# Patient Record
Sex: Female | Born: 1995 | Race: White | Hispanic: No | Marital: Single
Health system: Southern US, Community
[De-identification: ages and names within clinical notes are randomized; demographics above are authoritative.]

## PROBLEM LIST (undated history)

## (undated) DIAGNOSIS — G932 Benign intracranial hypertension: Principal | ICD-10-CM

---
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
## 2017-07-25 NOTE — Nursing Note (Signed)
Nursing Discharge Summary - Text       Physician Discharge Summary Entered On:  07/25/2017 19:15 EDT    Performed On:  07/25/2017 19:15 EDT by Sandara Tyree-PA,  Betti CruzKATE S               DC Information   Provider Instructions for Diet :   A Healthy Diet, Regular diet   Provider Instructions for Activity :   Other: STRICT NON WEIGHT BEARING RIGHT ACHILLES, ELEVATION ABOVE LEVEL OF HEART   Provider Instructions for Wound Care :   Other: KEEP SPLINT CLEAN, DRY, INTACT UNTIL FIRST POST OP VISIT   Nicosha Struve-PA,  Simpson Paulos S - 07/25/2017 19:15 EDT

## 2017-07-26 NOTE — Nursing Note (Signed)
Medication Administration Follow Up-Text       Medication Administration Follow Up Entered On:  07/26/2017 17:21 EDT    Performed On:  07/26/2017 16:46 EDT by Elijah BirkALDWELL, RN, TARA J      Intervention Information:     hydromorphone  Performed by Elijah BirkALDWELL, RN, TARA J on 07/26/2017 16:31:00 EDT       hydromorphone,0.5mg   IV Push,Antecubital, Right,other (see comment)       Med Response   ED Medication Response :   No adverse reaction, Symptoms improved   Numeric Rating Pain Scale :   6   Pasero Opioid Induced Sedation Scale :   1 = Awake and alert   CALDWELL, RN, TARA J - 07/26/2017 17:21 EDT

## 2017-07-26 NOTE — Discharge Summary (Signed)
 Inpatient Patient Summary               [facility]  [address] [place], [place] [address]  [phone]  Patient Discharge Instructions     Name: Carrie Shields, HENAULT  Current Date: 07/26/2017 16:38:59  DOB: 01-16-1995 [id number] FIN:[id number]%[phone]  Patient Address: [address] [place] [address]  Patient Phone: [phone]  Primary Care Provider:  Name: MELODY ORLEAN RUTH  Phone: [phone]   Immunizations Provided:      Discharge Diagnosis: Rupture of right Achilles tendon  Discharged To: TO, ANTICIPATED%>  Home Treatments: TREATMENTS, ANTICIPATED%>  Devices/Equipment: EQUIPMENT REHAB%>  Post Hospital Services: HOSPITAL SERVICES%>  Professional Skilled Services: SKILLED SERVICES%>  Therapist, sports and Community Resources: SERV AND COMM RES, ANTICIPATED%>  Mode of Discharge Transportation: TRANSPORTATION%>  Discharge Orders:          Discharge Patient 07/26/17 16:15:00 EDT, Discharge Home/Self Care         Comment:   Medications  During the course of your visit, your medication list was updated with the most current information. The details of those changes are reflected below:          New Medications  Other Medications  aspirin (aspirin 81 mg oral tablet) 1 Tabs Oral (given by mouth) 2 times a day for 45 Days. BEGIN IN AM 7/15, TWICE DAILY FOR DVT PROPHYLAXIS. Refills: 0.  Last Dose:____________________  docusate (Colace Clear oral capsule) 1 Capsules Oral (given by mouth) 2 times a day. Refills: 0.  Last Dose:____________________  ondansetron  (Zofran  4 mg oral tablet) 1 Tabs Oral (given by mouth) every 6 hours as needed as needed for nausea/vomiting for 5 Days. Refills: 1.  Last Dose:____________________  oxyCODONE  (oxyCODONE  5 mg oral tablet) 1 Tabs Oral (given by mouth) every 4 hours as needed moderate pain (4-7). 1-2 TABS EVERY 4-6 HR PRN PAIN. Refills: 0.  Last Dose:____________________  Medications that have not changed  Other Medications  cetirizine (ZyrTEC 10 mg oral tablet)  1 Tabs Oral (given by mouth) every day as needed.  Last Dose:____________________       [facility] would like to thank you for allowing us  to assist you with your healthcare needs. The following includes patient education materials and information regarding your injury/illness.   Shields, Carrie L has been given the following list of follow-up instructions, prescriptions, and patient education materials:  Follow-up Instructions:              With: Address: When:   f/u 10-14 days post op, call office for appt/with questions. [phone]                    It is important to always keep an active list of medications available so that you can share with other providers and manage your medications appropriately. As an additional courtesy, we are also providing you with your final active medications list that you can keep with you.            aspirin (aspirin 81 mg oral tablet) 1 Tabs Oral (given by mouth) 2 times a day for 45 Days. BEGIN IN AM 7/15, TWICE DAILY FOR DVT PROPHYLAXIS. Refills: 0.  cetirizine (ZyrTEC 10 mg oral tablet) 1 Tabs Oral (given by mouth) every day as needed.  docusate (Colace Clear oral capsule) 1 Capsules Oral (given by mouth) 2 times a day. Refills: 0.  ondansetron  (Zofran  4 mg oral tablet) 1 Tabs Oral (given by  mouth) every 6 hours as needed as needed for nausea/vomiting for 5 Days. Refills: 1.  oxyCODONE  (oxyCODONE  5 mg oral tablet) 1 Tabs Oral (given by mouth) every 4 hours as needed moderate pain (4-7). 1-2 TABS EVERY 4-6 HR PRN PAIN. Refills: 0.      Take only the medications listed above. Contact your doctor prior to taking any medications not on this list.  Discharge instructions, if any, will display below     Instructions for Diet: INSTRUCTIONS FOR DIET%>A Healthy Diet, Regular diet  Instructions for Supplements: SUPPLEMENT INSTRUCTIONS%>  Instructions for Activity: INSTRUCTIONS FOR ACTIVITY%>Other: STRICT NON WEIGHT BEARING RIGHT ACHILLES, ELEVATION ABOVE LEVEL OF  HEART  Instructions for Wound Care: INSTRUCTIONS FOR WOUND CARE%>Other: KEEP SPLINT CLEAN, DRY, INTACT UNTIL FIRST POST OP VISIT     Medication leaflets, if any, will display below     Patient education materials, if any, will display below        IS IT A STROKE? Act FAST and Check for these signs:    FACE                         Does the face look uneven?    ARM                         Does one arm drift down?    SPEECH                    Does their speech sound strange?    TIME                         Call 9-1-1 at any sign of stroke  Heart Attack Signs  Chest discomfort: Most heart attacks involve discomfort in the center of the chest and lasts more than a few minutes, or goes away and comes back. It can feel like uncomfortable pressure, squeezing, fullness or pain.  Discomfort in upper body: Symptoms can include pain or discomfort in one or both arms, back, neck, jaw or stomach.  Shortness of breath: With or without discomfort.  Other signs: Breaking out in a cold sweat, nausea, or lightheaded.  Remember, MINUTES DO MATTER. If you experience any of these heart attack warning signs, call 9-1-1 to get immediate medical attention!     ---------------------------------------------------------------------------------------------------------------------  [facility] allows you to manage your health, view your test results, and retrieve your discharge documents from your hospital stay securely and conveniently from your computer.  To begin the enrollment process, visit [email or web address] Click on "Sign up now" under [facility].

## 2017-07-26 NOTE — Nursing Note (Signed)
Adult Admission Assessment - Text       Perioperative Admission Assessment Entered On:  07/26/2017 12:22 EDT    Performed On:  07/26/2017 12:19 EDT by Elijah BirkALDWELL, RN, TARA J               General   Primary Care Physician/Specialists :   Joellyn QuailsJOHN SUTHERLAND   Emergency Contact Name :   Welton FlakesKATHLEEN Kamrowski- MOTHER   Emergency Contact Phone :   [phone]   Preferred Communication Mode :   Verbal   CALDWELL, RN, TARA J - 07/26/2017 12:19 EDT   Allergies   (As Of: 07/26/2017 12:22:01 EDT)   Allergies (Active)   Augmentin  Estimated Onset Date:   Unspecified ; Reactions:   itching ; Created By:   Delight HohUPRAS, RN, Leotis ShamesLAUREN A; Reaction Status:   Active ; Category:   Drug ; Substance:   Augmentin ; Type:   Allergy ; Severity:   Mild ; Updated By:   Delight HohUPRAS, RN, Jake ChurchLAUREN A; Reviewed Date:   07/26/2017 12:19 EDT      ertapenem  Estimated Onset Date:   Unspecified ; Reactions:   itching ; Created By:   Delight HohUPRAS, RN, LAUREN A; Reaction Status:   Active ; Category:   Drug ; Substance:   ertapenem ; Type:   Allergy ; Severity:   Moderate ; Updated By:   Nicholes CalamityUPRAS, RN, LAUREN A; Reviewed Date:   07/26/2017 12:19 EDT      Pincus SanesINVanz  Estimated Onset Date:   Unspecified ; Reactions:   Itching ; Created By:   Delight HohUPRAS, RN, Leotis ShamesLAUREN A; Reaction Status:   Active ; Category:   Drug ; Substance:   INVanz ; Type:   Allergy ; Severity:   Moderate ; Updated By:   Delight HohUPRAS, RN, Jake ChurchLAUREN A; Reviewed Date:   07/26/2017 12:19 EDT        Medication History   Medication List   (As Of: 07/26/2017 12:22:01 EDT)   Normal Order    Lactated Ringers Injection solution 1000 mL  :   Lactated Ringers Injection solution 1000 mL ; Status:   Ordered ; Ordered As Mnemonic:   Lactated Ringers Injection 1000 mL ; Simple Display Line:   10 mL/hr, IV ; Ordering Provider:   Barrett ShellEDEN-PA,  KATE S; Catalog Code:   Lactated Ringers Injection ; Order Dt/Tm:   07/25/2017 19:13:43 EDT ; Comment:   For Non Dialysis Patients          clindamycin  :   clindamycin ; Status:   Ordered ; Ordered As Mnemonic:    clindamycin IVPB ; Simple Display Line:   900 mg, IV Piggyback, On Call ; Ordering Provider:   EDEN-PA,  KATE S; Catalog Code:   clindamycin ; Order Dt/Tm:   07/25/2017 19:13:43 EDT ; Comment:   If Beta-Lactam allergic without MRSA            Home Meds    cetirizine  :   cetirizine ; Status:   Documented ; Ordered As Mnemonic:   ZyrTEC 10 mg oral tablet ; Simple Display Line:   10 mg, 1 tabs, Oral, Daily, PRN, 90 tabs, 0 Refill(s) ; Catalog Code:   cetirizine ; Order Dt/Tm:   03/03/2015 11:01:07 EST            Problem History   (As Of: 07/26/2017 12:22:01 EDT)   Problems(Active)    Alteration in comfort: pain (SNOMED CT  :[phone] )  Name of Problem:   Alteration in comfort:  pain ; Recorder:   SYSTEM,  SYSTEM; Confirmation:   Confirmed ; Classification:   Interdisciplinary ; Code:   [id number] ; Last Updated:   03/06/2015 11:38 EST ; Life Cycle Date:   03/06/2015 ; Life Cycle Status:   Active ; Vocabulary:   SNOMED CT   ; Comments:        03/06/2015 11:38 - SYSTEM,  SYSTEM  Problem added automatically by system based on initiation of Alteration in Comfort Plan of Care      At risk for infection (SNOMED CT  :621308657 )  Name of Problem:   At risk for infection ; Recorder:   SYSTEM,  SYSTEM; Confirmation:   Confirmed ; Classification:   Interdisciplinary ; Code:   [id number] ; Last Updated:   03/03/2015 15:59 EST ; Life Cycle Date:   03/03/2015 ; Life Cycle Status:   Active ; Vocabulary:   SNOMED CT   ; Comments:        03/03/2015 15:59 - SYSTEM,  SYSTEM  Problem added automatically by system based on initiation of Risk For Infection Plan of Care      Impaired tissue integrity (SNOMED CT  :[phone] )  Name of Problem:   Impaired tissue integrity ; Recorder:   SYSTEM,  SYSTEM; Confirmation:   Confirmed ; Classification:   Interdisciplinary ; Code:   [id number] ; Last Updated:   03/06/2015 11:38 EST ; Life Cycle Date:   03/06/2015 ; Life Cycle Status:   Active ; Vocabulary:   SNOMED CT   ; Comments:        03/06/2015 11:38 - SYSTEM,   SYSTEM  Problem added automatically by system based on initiation of Impaired Tissue Integrity Plan of Care      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  :[phone] )  Name of Problem:   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 ; Recorder:   SYSTEM,  SYSTEM; Confirmation:   Confirmed ; Classification:   Medical ; Code:   [id number] ; Last Updated:   02/14/2015 9:44 EST ; Life Cycle Date:   02/14/2015 ; Life Cycle Status:   Active ; Vocabulary:   SNOMED CT        Seasonal allergies (SNOMED CT  [phone] )  Name of Problem:   Seasonal allergies ; Recorder:   MCBRIDE, RN, ERIN R; Confirmation:   Confirmed ; Classification:   Patient Stated ; Code:   [phone] ; Contributor System:   PowerChart ; Last Updated:   01/19/2015 9:39 EST ; Life Cycle Date:   01/19/2015 ; Life Cycle Status:   Active ; Vocabulary:   SNOMED CT          Diagnoses(Active)    Rupture of right Achilles tendon  Date:   07/25/2017 ; Diagnosis Type:   Discharge ; Confirmation:   Confirmed ; Clinical Dx:   Rupture of right Achilles tendon ; Classification:   Medical ; Clinical Service:   Non-Specified ; Code:   ICD-10-CM ; Probability:   0 ; Diagnosis Code:   E42.353I        Procedure History        -    Procedure History   (As Of: 07/26/2017 12:22:01 EDT)     Procedure Dt/Tm:   01/22/2014 ; Anesthesia Minutes:   0 ; Procedure Name:   Extraction of wisdom tooth ; Procedure Minutes:   0 ; Last Reviewed Dt/Tm:   07/26/2017 12:20:59 EDT  Procedure Dt/Tm:   01/13/2013 ; Anesthesia Minutes:   0 ; Procedure Name:   Arthroscopy ; Procedure Minutes:   0 ; Comments:     01/19/2015 9:40 EST - Adin Hector, RN, ERIN R  RIGHT KNEE ; Last Reviewed Dt/Tm:   07/26/2017 12:20:59 EDT            Procedure Dt/Tm:   01/19/2015 13:19:00 EST ; Location:   [facility] ; Provider:   LAMB-MD,  JOSHUA H;  Anesthesia Type:   General ; :   FERLA-MD,  BRIAN P; Anesthesia Minutes:   0 ; Procedure Name:   Ankle Achilles Tendon Repair (Ankle, Left) ; Procedure Minutes:   26 ; Comments:     01/19/2015 14:45 EST - ZEIF, RN, REBECCA N  auto-populated from documented surgical case ; Clinical Service:   Surgery ; Last Reviewed Dt/Tm:   07/26/2017 12:20:59 EDT            Procedure Dt/Tm:   02/12/2015 16:52:00 EST ; Location:   [facility] ; Provider:   LAMB-MD,  JOSHUA H; Anesthesia Type:   General ; :   FERLA-MD,  BRIAN P; Anesthesia Minutes:   0 ; Procedure Name:   Ankle Achilles Tendon Repair (Left) ; Procedure Minutes:   43 ; Comments:     02/12/2015 18:26 EST - WELLS, RN, RON D  auto-populated from documented surgical case ; Clinical Service:   Surgery ; Last Reviewed Dt/Tm:   07/26/2017 12:20:59 EDT            Procedure Dt/Tm:   02/12/2015 16:52:00 EST ; Location:   [facility] ; Provider:   LAMB-MD,  JOSHUA H; Anesthesia Type:   General ; :   FERLA-MD,  BRIAN P; Anesthesia Minutes:   0 ; Procedure Name:   Ankle and/or Foot Incision and Drainage (Left) ; Procedure Minutes:   32 ; Comments:     02/12/2015 18:26 EST - WELLS, RN, RON D  auto-populated from documented surgical case ; Clinical Service:   Surgery ; Last Reviewed Dt/Tm:   07/26/2017 12:20:59 EDT            Procedure Dt/Tm:   03/04/2015 13:36:00 EST ; Location:   [facility] ; Provider:   Sibyl Parr; Anesthesia Type:   General ; :   Jenetta Loges; Anesthesia Minutes:   0 ; Procedure Name:   Ankle and/or Foot Incision and Drainage ; Procedure Minutes:   1502 ; Comments:     03/04/2015 15:04 EST - DANIEL, RN, MELISSA S  auto-populated from documented surgical case ; Clinical Service:   Surgery ; Last Reviewed Dt/Tm:   07/26/2017 12:20:59 EDT            History Confirmation   Problem History Changes PAT :   No   Procedure History Changes PAT :   No   CALDWELL, RN, TARA J - 07/26/2017 12:19 EDT   Anesthesia/Sedation   Anesthesia History :   Prior general anesthesia   SN - Malignant  Hyperthermia :   Patient   Previous Problem with Anesthesia :   None   CALDWELL, RN, TARA J - 07/26/2017 12:19 EDT   Bloodless Medicine   Will Patient Accept Blood Transfusion and/or Blood Products :   Yes   CALDWELL, RN, TARA J - 07/26/2017 12:19 EDT   ID Risk Screen Symptoms   Recent Travel History :   No recent travel   C. diff Symptom/History ID :   Neither  of the above   CALDWELL, RN, Dorien Chihuahua - 07/26/2017 12:19 EDT   Social History   Social History   (As Of: 07/26/2017 12:22:01 EDT)   Tobacco:        Never smoker   (Last Updated: 02/10/2015 16:37:45 EST by Colon Branch, RN, MELISSA)          Alcohol:        Denies   (Last Updated: 01/19/2015 09:41:36 EST by Adin Hector, RN, ERIN R)          Substance Abuse:        Denies   (Last Updated: 07/26/2017 12:21:43 EDT by Elijah Birk, RN, TARA J)            Advance Directive   Advance Directive :   No   CALDWELL, RN, TARA J - 07/26/2017 12:19 EDT   Harm Screen   Injuries/Abuse/Neglect in Household :   Denies   Suicidal Behavior :   None   Self Harming Behavior :   None   Suicidal Ideation :   None   CALDWELL, RN, TARA J - 07/26/2017 12:19 EDT

## 2017-07-26 NOTE — Discharge Summary (Signed)
 Inpatient Clinical Summary             [facility]  Post-Acute Care Transfer Instructions  PERSON INFORMATION   Name: Carrie Shields, Carrie Shields  MRN: [id number]    FIN#: [id number]   PHYSICIANS  Admitting Physician: DELIAH MAFFUCCI  Attending Physician: DELIAH MAFFUCCI   PCP: MELODY ARRANT LEE  Discharge Diagnosis:  Rupture of right Achilles tendon  Comment:       PATIENT EDUCATION INFORMATION  Instructions:               Medication Leaflets:               Follow-up:                           With: Address: When:   f/u 10-14 days post op, call office for appt/with questions. [phone]                               MEDICATION LIST  Medication Reconciliation at Discharge:          New Medications  Other Medications  aspirin (aspirin 81 mg oral tablet) 1 Tabs Oral (given by mouth) 2 times a day for 45 Days. BEGIN IN AM 7/15, TWICE DAILY FOR DVT PROPHYLAXIS. Refills: 0.  Last Dose:____________________  docusate (Colace Clear oral capsule) 1 Capsules Oral (given by mouth) 2 times a day. Refills: 0.  Last Dose:____________________  ondansetron  (Zofran  4 mg oral tablet) 1 Tabs Oral (given by mouth) every 6 hours as needed as needed for nausea/vomiting for 5 Days. Refills: 1.  Last Dose:____________________  oxyCODONE  (oxyCODONE  5 mg oral tablet) 1 Tabs Oral (given by mouth) every 4 hours as needed moderate pain (4-7). 1-2 TABS EVERY 4-6 HR PRN PAIN. Refills: 0.  Last Dose:____________________  Medications that have not changed  Other Medications  cetirizine (ZyrTEC 10 mg oral tablet) 1 Tabs Oral (given by mouth) every day as needed.  Last Dose:____________________         Patient's Final Home Medication List Upon Discharge:           aspirin (aspirin 81 mg oral tablet) 1 Tabs Oral (given by mouth) 2 times a day for 45 Days. BEGIN IN AM 7/15, TWICE DAILY FOR DVT PROPHYLAXIS. Refills: 0.  cetirizine (ZyrTEC 10 mg oral tablet) 1 Tabs Oral (given by mouth) every day as needed.  docusate (Colace Clear oral  capsule) 1 Capsules Oral (given by mouth) 2 times a day. Refills: 0.  ondansetron  (Zofran  4 mg oral tablet) 1 Tabs Oral (given by mouth) every 6 hours as needed as needed for nausea/vomiting for 5 Days. Refills: 1.  oxyCODONE  (oxyCODONE  5 mg oral tablet) 1 Tabs Oral (given by mouth) every 4 hours as needed moderate pain (4-7). 1-2 TABS EVERY 4-6 HR PRN PAIN. Refills: 0.         Comment:       ORDERS          Order Name Order Details   Discharge Patient 07/26/17 16:15:00 EDT, Discharge Home/Self Care

## 2017-07-26 NOTE — Nursing Note (Signed)
Medication Administration Follow Up-Text       Medication Administration Follow Up Entered On:  07/26/2017 17:21 EDT    Performed On:  07/26/2017 16:28 EDT by Elijah BirkALDWELL, RN, TARA J      Intervention Information:     hydromorphone  Performed by Elijah BirkALDWELL, RN, TARA J on 07/26/2017 16:13:00 EDT       hydromorphone,0.5mg   IV Push,Antecubital, Right,other (see comment)       Med Response   ED Medication Response :   No adverse reaction, Symptoms improved   Numeric Rating Pain Scale :   7   Pasero Opioid Induced Sedation Scale :   1 = Awake and alert   CALDWELL, RN, TARA J - 07/26/2017 17:21 EDT

## 2017-07-26 NOTE — Procedures (Signed)
IntraOp Record - [facility]             IntraOp Record - [facility] Summary                                                                   Primary Physician:        Blair Hailey    Case Number:              [id number]    Finalized Date/Time:      07/26/17 16:05:43    Pt. Name:                 Carrie Shields, Carrie Shields    D.O.B./Sex:               15-Nov-1995    Female    Med Rec #:                [id number]    Physician:                Blair Hailey    Financial #:              [id number]    Pt. Type:                 S    Room/Bed:                 /    Admit/Disch:              07/26/17 10:52:00 -    Institution:       [facility] - Case Times                                                                                                         Entry 1                                                                                                          Patient      In Room Time             07/26/17 15:10:00               Out Room Time                   07/26/17 16:00:00    Anesthesia     Procedure  Start Time               07/26/17 15:26:00               Stop Time                       07/26/17 15:57:00    Last Modified By:         Loma Boston RN, Benjamine Mola                              07/26/17 16:00:59      [facility] - Case Times Audit                                                                          07/26/17 16:00:59         Owner: [id number]                              Modifier: [id number]                                                       <+> 1         Out Room Time     07/26/17 15:58:00         Owner: [id number]                              Modifier: [id number]                                                       <+> 1         Stop Time     07/26/17 15:27:29         Owner: [id number]                              Modifier: [id number]                                                       <+> 1         Start Time        [facility] - Case Attendance  Entry 1                         Entry 2                         Entry 3                                          Case Attendee             OHLSON-MD,  Vivia [place],  [place], RN, [facility]    Role Performed            Surgeon Primary                 Surgical Scrub                  Circulator    Time In                   07/26/17 15:10:00               07/26/17 15:10:00               07/26/17 15:10:00    Time Out     Procedure                 Ankle Achilles Tendon           Ankle Achilles Tendon           Ankle Achilles Tendon                              Repair(Right)                   Repair(Right)                   Repair(Right)    Last Modified By:         Loma Boston, RN, Dorrene German, RN, Dorrene German, RN, [facility]                              07/26/17 15:23:19               07/26/17 15:23:19               07/26/17 15:23:19                                Entry 4                         Entry 5  Case Attendee             PENDARVIS-MD, Grace Blight,  KATE S                              E    Role Performed            Anesthesiologist                First Assistant    Time In                   07/26/17 15:10:00    Time Out     Procedure                 Ankle Achilles Tendon           Ankle Achilles Tendon                              Repair(Right)                   Repair(Right)    Last Modified By:         Loma Boston, RN, Dorrene German, RN, Elizabeth                              07/26/17 15:23:19               07/26/17 15:24:27      [facility] - Case Attendance Audit                                                                     07/26/17 15:24:27         Owner: [id number]                              Modifier: [id number]                                                       <+> 5         Case Attendee        <+> 5         Role Performed        <+> 5          Procedure     07/26/17 15:23:19         Owner: [id number]                              Modifier: [id number]  1     <+> Time In            1     <*> Procedure                              Ankle Achilles Tendon Repair(Right)            2     <+> Time In            2     <*> Procedure                              Ankle Achilles Tendon Repair(Right)            3     <+> Time In            3     <*> Procedure                              Ankle Achilles Tendon Repair(Right)            4     <+> Time In            4     <*> Procedure                              Ankle Achilles Tendon Repair(Right)     07/26/17 14:12:25         Owner: [id number]                              Modifier: [id number]                                                       <+> 2         Case Attendee        <+> 2         Role Performed        <+> 2         Procedure        <+> 3         Case Attendee        <+> 3         Role Performed        <+> 3         Procedure        <+> 4         Case Attendee        <+> 4         Role Performed        <+> 4         Procedure        [facility] - Skin Assessment  Pre-Care Text:            A.240 Assesses baseline skin condition Im.120 Implements protective measures to prevent skin or tissue injury           due to mechanical sources  Im.280.1 Implements progective measures to prevent skin or tissue injury due to           thermal sources Im.360 Monitors for signs and symptons of infection                              Entry 1                                                                                                          Skin Integrity            Intact    Last Modified By:         Loma Boston, RN, Elizabeth                              07/26/17 15:24:01    Post-Care Text:            E.10 Evaluates for signs and symptoms of physical injury to skin and tissue E.270 Evaluate tissue perfusion            O.60 Patient is free from signs and symptoms of injury caused by extraneous objects   O.210 Patinet's tissue           perfusion is consistent with or improved from baseline levels      [facility] - Patient Positioning                                                                      Pre-Care Text:            A.240 Assesses baseline skin condition A.280 Identifies baseline musculoskeletal status A.280.1 Identifies           physical alterations that require additional precautions for procedure-specific positioning A.510.8 Maintains           patient's dignity and privacy Im.120 Implements protective measures to prevent skin/tissue injury due to           mechanical sources Im.40 Positions the patient Im.80 Applies safety devices                              Entry 1  Procedure                 Ankle Achilles Tendon           Body Position                   Prone                              Repair(Right)    Left Arm Position         Flexed on Padded Arm            Right Arm Position              Flexed on Padded Arm                              Board w/Security Strap                                          Board w/Security Strap    Left Leg Position         Extended Security               Right Leg Position              Extended Security                              Strap, Pillow Under                                             Strap, Pillow Under                              Knee, Pillow Under                                              Knee, Held on Field                              Lower Leg    Feet Uncrossed            Yes                             Pressure Points                 Yes                                                              Checked     Positioning Device        Pillow, Gel Roll, Head          Positioned By  OHLSON-MD,  BLAKE,                              Rest Foam, Gel  Pad,                                             EDEN-PA,  KATE S,                              Safety Strap                                                    PENDARVIS-MD, JR, Ena Dawley, RN, Elizabeth    Outcome Met (O.80)        Yes    Last Modified By:         Loma Boston, RN, [place]                              07/26/17 15:27:12    Post-Care Text:            E.10 Evaluates for signs and symptoms of physical injury to skin and tissue E.290 Evaluates musculoskeletal           status O.80 Patient is free from signs and symptoms of injury related to positioning O.120 the patient is free           from signs and symptoms of injury related to transfer/transport  O.250 Patient's musculoskeletal status is           maintained at or improved from baseline levels      [facility] - Skin Prep                                                                                Pre-Care Text:            A.30 Verifies allergies A.20 Verifies procedure, surgical site, and laterality A.510.8 Maintains paritnet's           dignity and privacy Im.270 Performs Skin Preparation Im.270.1 Implements protective measures to prevent skin           and tissue injury due to chemical sources  A.300.1 Protects from cross-contamination  Entry 1                                                                                                          Hair Removal     Skin Prep      Prep Agents (Im.270)     Chlorhexidine Gluconate         Prep Area (Im.270)              Ankle and Foot, Leg                              2% w/Alcohol                                                    Foot Right     Prep By                  Blair Hailey    Outcome Met (O.100)       Yes    Last Modified By:         Loma Boston, RN, [place]                              07/26/17 15:24:16    Post-Care Text:            E.10 Evaluates for signs and symptoms  of physical injury to skin and tissue O.100 Patient is free from signs           and symptoms of chemical injury  O.740 The patient's right to privacy is maintained      [facility] - Counts Initial and Final                                                                 Pre-Care Text:            A.28 Verifies operative procedure, sugical site, and laterality A.20.2 Assesses the risk for unintended           retained foreign body Im.20 Performs required counts                              Entry 1  Initial Counts      Initial Counts           Loma Boston, RN, Benjamine Mola,          Items included in               Sponges, Sharps     Performed By             Raynelle Highland C              the Initial Count     Final Counts      Final Counts             THOMAS,  [place],             Final Count Status              Correct     Performed By             Loma Boston, RN, Elizabeth     Items Included in        Sponges, Sharps     Final Count     Outcome Met (O.20)        Yes    Last Modified By:         Loma Boston, RN, Benjamine Mola                              07/26/17 15:54:33    Post-Care Text:            E.50 Evaluates results of the surgical count O.20 Patient is free from unintended retained foreign objects      MP[facility] - Counts Initial and Final Audit                                                            07/26/17 15:54:33         Owner: [id number]                              Modifier: [id number]                                                       <+> 1         Final Counts Performed By        <+> 1         Final Count Status        <+> 1         Items Included in Final Count        <+> 1         Outcome Met (O.20)        [facility] - Counts Additional  Pre-Care Text:            A.87 Verifies operative procedure, sugical site, and laterality A.20.2 Assesses the risk for  unintended           retained foreign body Im.20 Performs required counts                              Entry 1                                                                                                          Additional Count          Closing Count                   Additional Count                Loma Boston, RN, Benjamine Mola,    Type                                                      Participants                    THOMAS,  CANDICE C    Count Status              Correct                         Items Counted                   Sponges, Sharps    Outcome Met (O.20)        Yes    Last Modified By:         Loma Boston, RN, Benjamine Mola                              07/26/17 15:45:14    Post-Care Text:            E.50 Evaluates results of the surgical count O.20 Patient is free from unintended retained foreign objects      [facility] - Counts Additional Audit                                                                   07/26/17 15:45:14         Owner: [id number]                              Modifier: [id number]                                                       <+>  1         Count Status        <+> 1         Items Counted        <+> 1         Outcome Met (O.20)        [facility] - General Case Data                                                                        Pre-Care Text:            A.350.1 Classifies surgical wound                              Entry 1                                                                                                          Case Information      ASA Class                2                               Case Level                      Level 3     OR                       MP 01                           Specialty                       Orthopedic (SN)     Wound Class              1-Clean    Preop Diagnosis           achilles tendon rupture         Postop Diagnosis                achilles tendon rupture    Last Modified By:         Loma Boston, RN, Benjamine Mola                              07/26/17  15:23:44    Post-Care Text:            O.760 Patient receives consistent and comparable care regardless of the setting      [facility] - General Case Data Audit  07/26/17 15:23:44         Owner: [id number]                              Modifier: [id number]                                                       <+> 1         Preop Diagnosis        <+> 1         Postop Diagnosis        [facility] - Fire Risk Assessment                                                                                               Entry 1                                                                                                          Fire Risk                 Alcohol Based Prep              Fire Risk Score                 2    Assessment: If            Solution, Ignition    checked, checkmark        Source In Use    = 1 point     Last Modified By:         Loma Boston, RN, Benjamine Mola                              07/26/17 14:42:41      [facility] - Time Out                                                                                 Pre-Care Text:            A.10 Confirms patient identity A.20 Verifies operative procedure, surgical site, and laterality A.20.1 Verifies  consent for planned procedure A.30 Verifies allergies                              Entry 1                                                                                                          Procedure                 Ankle Achilles Tendon           Is everyone ready               Yes                              Repair(Right)                   to perform time out     Have all members of       Yes                             Patient name and                Yes    the surgical team                                         DOB confirmed     been introduced     Allergies discussed       Yes                             Surgical procedure              Yes                                                              to be  performed                                                               confirmed and  verified by                                                               completed surgical                                                               consent     Correct surgical          Yes                             Correct laterality              Yes    site marked and                                           confirmed     initials are     visible through     prepped and draped     field (or     alternative ID band     used)     Correct patient           Yes                             Surgeon shares                  Yes    position confirmed                                        operative plan,                                                               possible                                                               difficulties,                                                               expected duration,  anticipated blood                                                               loss and reviews                                                               all                                                               critical/specific                                                               concerns     Required blood            Yes                             Essential imaging               Yes    products, implants,                                       available and fetal     devices and/or                                            heartones confirmed     special equipment                                         (if applicable)     available and     sterility confirmed     VTE prophylaxis           Yes                             Antibiotics ordered             Yes    addressed                                                 and administered     Anesthesia shares  Yes                             Fire risk                       Yes    anesthetic plan and                                       assessment scored     reviews patient                                           and plan discussed     specific concerns     Appropriate drying        Yes                             Surgeon states:                 Yes    time for prep                                             Does anyone have     observed before                                           any concerns? If     draping                                                   you see, suspect,                                                               or feel that                                                               patient care is                                                               being compromised,  speak up for                                                               patient safety     Time Out Complete         07/26/17 15:25:00    Last Modified By:         Loma Boston, RN, Benjamine Mola                              07/26/17 15:25:32    Post-Care Text:            E.30 Evaluates verification process for correct patient, site, side, and level surgery      [facility] - Time Out Audit                                                                            07/26/17 15:25:32         Owner: [id number]                              Modifier: [id number]                                                           1     <*> Procedure                              Ankle Achilles Tendon Repair(Right)            1     <+> Time Out Complete        [facility] - Debrief                                                                                  Pre-Care Text:            Im.330 Manages specimen handling and disposition                              Entry 1  Procedure                  Ankle Achilles Tendon           Final counts                    Yes                              Repair(Right)                   correct and                                                               verbally verified                                                               with                                                               surgeon/licensed                                                               independent                                                               practitioner (if                                                               applicable)     Actual procedure          Yes                             Postop diagnosis                Yes    performed confirmed                                       confirmed     Wound  Yes                             Confirm specimens               Yes    classification                                            and specimens     confirmed                                                 labeled                                                               appropriately (if                                                               applicable)     Foley catheter            Yes                             Patient recovery                Yes    removed (if                                               plan confirmed     applicable)     Debrief Complete          07/26/17 15:50:00    Last Modified By:         Loma Boston, RN, [place]                              07/26/17 15:53:10    Post-Care Text:            E.800 Ensures continuity of care E.50 Evaluates results of the surgical count O.30 Patient's procedure is           performed on the correct site, side, and level O.50 patient's current status is communicated throughout the           continuum of care O.40 Patient's specimen(s) is managed in the appropriate manner      [facility] - Debrief Audit  07/26/17 15:53:10         Owner: [id number]                              Modifier: [id number]                                                           1     <*> Procedure                              Ankle Achilles Tendon Repair(Right)            1     <+> Debrief Complete        MPOR - Cautery                                                                                  Pre-Care Text:            A.240 Assesses baseline skin condition A280.1 Identifies baseline musculoskeletal status Im.50 Implements           protective measures to prevent injury due to electrical sources  Im.60 Uses supplies and equipment within safe           parameters Im.80 Applies safety devices                              Entry 1                                                                                                          ESU Type                  GENERATOR                       Identification                  [id number]                              [facility]              Number     Coag Setting (watts)      45  Cut Setting (watts)             45    Grounding Pad             Yes                             Grounding Pad Site              Thigh, right    Needed?     [place], RNBenjamine Mola           ESU Comment                     [id number] x exp06/2021    Applied By     Outcome Met (O.10)        Yes    Last Modified By:         Loma Boston, RN, [place]                              07/26/17 15:26:07    Post-Care Text:            E.10 Evaluates for signs and symptoms of physical injury to skin and tissue O.10 Patient is free from signs and           symptoms of injury related to thermal sources  O.70 Patient is free from signs and symptoms of electrical injury      [facility] - Patient Care Devices                                                                     Pre-Care Text:            A.200 Assesses risk for normothermia regulation A.40 Verifies presence of prosthetics or  corrective devices           Im.55 Implements thermoregulation measures Im.60 Uses supplies and equipment within safe parameters                              Entry 1                                                                                                          Equipment Type            MACHINE SEQUENTIAL              SCD Sleeve Site                 Leg Left  COMPRESSION    Equipment/Tag Number      [id number]                       Initiated Pre                   Yes                                                              Induction     Last Modified By:         Loma Boston, RN, [place]                              07/26/17 15:23:54    Post-Care Text:            E.10 Evaluates signs and symptoms of physical injury to skin and tissue O.60 Patient is free from signs and           symptoms of injury caused by extraneous objects      [facility] - Medications                                                                              Pre-Care Text:            A.10 Confirms patient identity A.30 Verifies allergies Im.220 Administers prescribed medications Im.220.2           Administers prescribed antibiotic therapy as ordered                              Entry 1                                                                                                          Medication                ROPIVACAINE HCL                 Route of Admin                  Local Injection                              INJECTION 5MG/ML  30 ML    Dose/Volume               69m  Site                            Ankle    (include amount and     unit of measure)     Site Detail               Right                           Administered By                 Blair Hailey    Outcome Met (O.130)       Yes    Last Modified By:         Loma Boston, RN, Benjamine Mola                              07/26/17 15:26:23    Post-Care Text:            E.20 Evaluates response to medications O.130 Patient receives  appropriately administerd medication(s)      MPOR - Dressing/Packing                                                                         Pre-Care Text:            A.350 Assesses susceptibility for infection Im.250 Administers care to invasive devices Im.290 Administer care           to wound sites  Im.300 Implements aseptic technique                              Entry 1                                                                                                          Site                      Ankle                           Site Details                    Right    Dressing Item     Details      Dressing Item            Medicated Gauze, 4x4's,         Cast/Splint (Im.290)            Cast Padding, Plaster     (Im.290)  Self Adherent Wrap,                                             Cast                              Other (See Comment)    Last Modified By:         Loma Boston, RN, Benjamine Mola                              07/26/17 15:32:55    Post-Care Text:            E.320 Evaluate factors associted with increased risk for postoperative infection at the completion of the           procedure O.200 Patient's wound perfusion is consistent with or improved from baseline levels  O.Patient is           free from signs and symptoms of infection    General Comments:            webril      MPOR - Procedures                                                                               Pre-Care Text:            A.20 Verifies operative procedure, surgical site, and laterality Im.150 Develops individualized plan of care                              Entry 1                                                                                                          Procedure     Description      Procedure                Ankle Achilles Tendon           Modifiers                       Right                              Repair     Surgical Procedure       Achilles Tendon Repair     Text  Right     Primary Procedure         Yes                             Primary Surgeon                 Blair Hailey    Start                     07/26/17 15:26:00               Stop                            07/26/17 15:57:00    Anesthesia Type           General                         Surgical Service                Orthopedic (SN)    Wound Class               1-Clean    Last Modified By:         Loma Boston, RN, Benjamine Mola                              07/26/17 15:58:01    Post-Care Text:            O.730 The patinet's care is consistent with the individualized perioperative plan of care      MPOR - Transfer                                                                                                           Entry 1                                                                                                          Transferred By            PENDARVIS-MD, Dereck Leep         Via                             Deforest Hoyles, RN, Niland  Post-op Destination       PACU    Skin Assessment      Condition                Intact    Last Modified By:         Loma Boston, RN, Elizabeth                              07/26/17 15:33:27      Case Comments                                                                                         <None>              Finalized By: Loma Boston RN, [facility]      Document Signatures                                                                             Signed By:           Loma Boston, RN, Elizabeth 10/93/23 16:05

## 2017-10-08 NOTE — Nursing Note (Signed)
Adult Admission Assessment - Text       Perioperative Admission Assessment Entered On:  10/08/2017 15:48 EDT    Performed On:  10/08/2017 15:39 EDT by Celine Ahr, RN, [name]               General   Height/Length Estimated :   182.88 cm(Converted to: 6 ft 0 in, 6.00 ft, 72.00 in)    Weight   Estimated :   102 kg(Converted to: 224 lb 14 oz, 224.872 lb)    [place], RN, [name] - 10/09/2017 9:21 EDT   Call Start :   10/09/2017 8:53 EDT   Call Complete :   10/09/2017 9:05 EDT   Celine Ahr, RN, [name] - 10/09/2017 8:52 EDT   Languages :   Cindy Hazy, RN, [name] - 10/09/2017 8:41 EDT   Information Given By :   Selinda Eon, RN, [name] - 10/09/2017 8:52 EDT     Primary Care Physician/Specialists :   DR. Shawnie Pons, PCP   Celine Ahr RN, [name] - 10/09/2017 8:41 EDT     Emergency Contact Name :   Truett Perna- MOTHER   Celine Ahr, RN, [name] - 10/08/2017 15:39 EDT   Emergency Contact Phone :   [phone], [place], RN, [name] - 10/09/2017 8:41 EDT     Allergies   (As Of: 10/13/2017 10:05:46 EDT)   Allergies (Active)   Augmentin  Estimated Onset Date:   Unspecified ; Reactions:   Hives / Itching ; Created By:   Celine Ahr, RN, [name]; Reaction Status:   Active ; Category:   Drug ; Substance:   Augmentin ; Type:   Allergy ; Severity:   Unknown ; Updated By:   Celine Ahr, RN, Mauri Reading; Reviewed Date:   10/13/2017 10:04 EDT      ertapenem  Estimated Onset Date:   Unspecified ; Reactions:   Hives / Itching ; Created By:   Celine Ahr, RN, [name]; Reaction Status:   Active ; Category:   Drug ; Substance:   ertapenem ; Type:   Allergy ; Severity:   Unknown ; Updated By:   Celine Ahr RN, Mauri Reading; Reviewed Date:   10/13/2017 10:04 EDT        Medication History   Medication List   (As Of: 10/13/2017 10:05:46 EDT)   Normal Order    Sodium Chloride 0.9%  solution 500 mL  :   Sodium Chloride 0.9%  solution 500 mL ; Status:   Ordered ; Ordered As Mnemonic:   Sodium Chloride 0.9% 500 mL ; Simple Display Line:   15 mL/hr, IV ; Ordering Provider:    Blair Hailey; Catalog Code:   Sodium Chloride 0.9% ; Order Dt/Tm:   10/13/2017 09:56:36 EDT ; Comment:   For Patients with Serum Creatinine > 2.'5mg'$ /dL, End-Stage Renal Disease, Hemodialysis          Lactated Ringers Injection solution 1000 mL  :   Lactated Ringers Injection solution 1000 mL ; Status:   Ordered ; Ordered As Mnemonic:   Lactated Ringers Injection 1000 mL ; Simple Display Line:   10 mL/hr, IV ; Ordering Provider:   EDEN-PA,  KATE S; Catalog Code:   Lactated Ringers Injection ; Order Dt/Tm:   10/12/2017 20:52:49 EDT ; Comment:   For Non Dialysis Patients          clindamycin  :   clindamycin ; Status:   Canceled ;  Ordered As Mnemonic:   clindamycin IVPB ; Simple Display Line:   900 mg, IV Piggyback, q8hr ; Ordering Provider:   EDEN-PA,  KATE S; Catalog Code:   clindamycin ; Order Dt/Tm:   10/12/2017 20:52:49 EDT          sodium chloride 0.9% Inj Soln 10 mL syringe  :   sodium chloride 0.9% Inj Soln 10 mL syringe ; Status:   Ordered ; Ordered As Mnemonic:   sodium chloride 0.9% flush syringe range dose ; Simple Display Line:   30 mL, IV Push, q8hr ; Ordering Provider:   Blair Hailey; Catalog Code:   sodium chloride flush ; Order Dt/Tm:   10/13/2017 09:56:17 EDT          A Patient Specific Medication  :   A Patient Specific Medication ; Status:   Ordered ; Ordered As Mnemonic:   A Patient Specific Medication ; Simple Display Line:   1 EA, Kit-Combo, q76mn, PRN: other (see comment) ; Ordering Provider:   OBlair Hailey Catalog Code:   A Patient Specific Medication ; Order Dt/Tm:   10/13/2017 09:56:18 EDT          A Patient Specific Refrigerated Medication  :   A Patient Specific Refrigerated Medication ; Status:   Ordered ; Ordered As Mnemonic:   A Patient Specific Refrigerated Medication ; Simple Display Line:   1 EA, Kit-Combo, q568m, PRN: other (see comment) ; Ordering Provider:   OHBlair HaileyCatalog Code:   A Patient Specific Refrigerated Medicati ; Order Dt/Tm:   10/13/2017 09:56:18 EDT  ; Comment:   to access the patient specific Refrigerated medications          Delivery and Return Bi[place]ccess  :   Delivery and Return Bi[place]ccess ; Status:   Ordered ; Ordered As Mnemonic:   Delivery and Return Bin Access ; Simple Display Line:   1 EA, Kit-Combo, q5m58m PRN: other (see comment) ; Ordering Provider:   OHLBlair Haileyatalog Code:   Delivery and Return Bin Access ; Order Dt/Tm:   10/13/2017 09:56:18 EDT ; Comment:   This code grants access to the [employer] the Delivery and Return Bin Access          lidocaine 1% PF Inj Soln 2 mL  :   lidocaine 1% PF Inj Soln 2 mL ; Status:   Ordered ; Ordered As Mnemonic:   lidocaine 1% preservative-free injectable solution ; Simple Display Line:   0.25 mL, ID, q5mi43mPRN: other (see comment) ; Ordering Provider:   OHLSBlair Haileytalog Code:   lidocaine ; Order Dt/Tm:   10/13/2017 09:56:17 EDT ; Comment:   to access lidocaine 1%  2 mL vial for IV start and Life Port access          lidocaine 2% Topical Gel with applicator 10 mL  :   lidocaine 2% Topical Gel with applicator 10 mL ; Status:   Ordered ; Ordered As Mnemonic:   Uro-Jet 2% topical gel with applicator ; Simple Display Line:   1 app, Topical, q5min31mRN: other (see comment) ; Ordering Provider:   OHLSOBlair Haileyalog Code:   lidocaine topical ; Order Dt/Tm:   10/13/2017 09:56:18 EDT          Respiratory MDI Treatment  :   Respiratory MDI Treatment ; Status:   Ordered ; Ordered As Mnemonic:   Respiratory MDI Treatment ; Simple Display Line:  1 EA, Kit-Combo, q37mn, PRN: other (see comment) ; Ordering Provider:   OBlair Hailey Catalog Code:   Respiratory MDI Treatment ; Order Dt/Tm:   10/13/2017 09:56:18 EDT          sodium chloride 0.9% Inj Soln 10 mL syringe  :   sodium chloride 0.9% Inj Soln 10 mL syringe ; Status:   Ordered ; Ordered As Mnemonic:   sodium chloride 0.9% flush syringe range dose ; Simple Display Line:   30 mL, IV Push, q584m, PRN: other (see comment) ;  Ordering Provider:   OHBlair HaileyCatalog Code:   sodium chloride flush ; Order Dt/Tm:   10/13/2017 09:56:09 EDT          sodium chloride 0.9% Inj Soln 10 mL vial  :   sodium chloride 0.9% Inj Soln 10 mL vial ; Status:   Ordered ; Ordered As Mnemonic:   sodium chloride 0.9% vial for reconstitution range dose ; Simple Display Line:   30 mL, IV Push, q5m69m PRN: other (see comment) ; Ordering Provider:   OHLBlair Haileyatalog Code:   sodium chloride flush ; Order Dt/Tm:   10/13/2017 09:56:17 EDT ; Comment:   for access to sodium chloride 0.9% vial when needed as a diluent for reconstitutable medications          sterile water Inj Soln 10 mL  :   sterile water Inj Soln 10 mL ; Status:   Ordered ; Ordered As Mnemonic:   sterile water for reconstitution ; Simple Display Line:   10 mL, N/A, q5mi45mPRN: other (see comment) ; Ordering Provider:   OHLSBlair Haileytalog Code:   sterile water for reconstitution ; Order Dt/Tm:   10/13/2017 09:56:17 EDT ; Comment:   Access sterile water when needed as a diluent for reconstitutable medications. Not for IV use.          acetaminophen 500 mg Tab  :   acetaminophen 500 mg Tab ; Status:   Ordered ; Ordered As Mnemonic:   Tylenol ; Simple Display Line:   1,000 mg, 2 tabs, Oral, On Call ; Ordering Provider:   EDEN-PA,  KATE S; Catalog Code:   acetaminophen ; Order Dt/Tm:   10/13/2017 09:55:54 EDT          clindamycin  :   clindamycin ; Status:   Ordered ; Ordered As Mnemonic:   clindamycin IVPB ; Simple Display Line:   900 mg, 50 mL, 100 mL/hr, IV Piggyback, On Call ; Ordering Provider:   EDEN-PA,  KATE S; Catalog Code:   clindamycin ; Order Dt/Tm:   10/12/2017 20:52:49 EDT ; Comment:   If Beta-Lactam allergic without MRSA          gabapentin 300 mg Cap  :   gabapentin 300 mg Cap ; Status:   Ordered ; Ordered As Mnemonic:   Neurontin ; Simple Display Line:   300 mg, 1 caps, Oral, On Call ; Ordering Provider:   EDEN-PA,  KATE S; Catalog Code:   gabapentin ; Order Dt/Tm:    10/13/2017 09:55:57 EDT            Home Meds    ibuprofen  :   ibuprofen ; Status:   Documented ; Ordered As Mnemonic:   ibuprofen ; Simple Display Line:   800 mg, Oral, TID, PRN: mild pain (1-3), 0 Refill(s) ; Catalog Code:   ibuprofen ; Order Dt/Tm:   10/09/2017 09:24:40 EDT  Misc Rx Supply  :   Misc Rx Supply ; Status:   Documented ; Ordered As Mnemonic:   Antibiotic ointment ; Simple Display Line:   1 application, Topical, Daily, right foot daily, 0 Refill(s) ; Catalog Code:   Misc Rx Supply ; Order Dt/Tm:   10/09/2017 09:24:52 EDT          minocycline  :   minocycline ; Status:   Documented ; Ordered As Mnemonic:   minocycline 100 mg oral capsule ; Simple Display Line:   100 mg, 1 caps, Oral, BID, 0 Refill(s) ; Catalog Code:   minocycline ; Order Dt/Tm:   10/09/2017 09:01:16 EDT          norgestimate-ethinyl estradiol  :   norgestimate-ethinyl estradiol ; Status:   Documented ; Ordered As Mnemonic:   Sprintec 0.25 mg-35 mcg oral tablet ; Simple Display Line:   1 tabs, Oral, Daily, 28 tabs, 0 Refill(s) ; Catalog Code:   norgestimate-ethinyl estradiol ; Order Dt/Tm:   10/09/2017 09:01:16 EDT          cetirizine  :   cetirizine ; Status:   Documented ; Ordered As Mnemonic:   ZyrTEC 10 mg oral tablet ; Simple Display Line:   10 mg, 1 tabs, Oral, Daily, PRN: as needed for allergy symptoms, 90 tabs, 0 Refill(s) ; Catalog Code:   cetirizine ; Order Dt/Tm:   03/03/2015 11:01:07 EST            Problem History   (As Of: 10/13/2017 10:05:46 EDT)   Problems(Active)    Anxiety and depression (SNOMED CT  :[id number] )  Name of Problem:   Anxiety and depression ; Recorder:   [name], RN, [name]; Confirmation:   Confirmed ; Classification:   Patient Stated ; Code:   [id number] ; Contributor System:   PowerChart ; Last Updated:   10/09/2017 9:08 EDT ; Life Cycle Date:   10/09/2017 ; Life Cycle Status:   Active ; Vocabulary:   SNOMED CT        Delayed wound healing (SNOMED CT  :147829562406471018 )  Name of Problem:   Delayed wound healing ;  Recorder:   CANNON, RN, [name]; Confirmation:   Confirmed ; Classification:   Patient Stated ; Code:   [id number] ; Contributor System:   DietitianowerChart ; Last Updated:   10/09/2017 9:31 EDT ; Life Cycle Status:   Active ; Vocabulary:   SNOMED CT   ; Comments:        10/09/2017 9:31 - [name], RN, [name]  s/p right achilles tendon repair (small area per PA note)      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  :[id number] )  Name of Problem:   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 ; Recorder:   SYSTEM,  SYSTEM; Confirmation:   Confirmed ; Classification:   Medical ; Code:   [id number]:   [id number] ; Last Updated:   02/14/2015 9:44 EST ; Life Cycle Date:   02/14/2015 ; Life Cycle Status:   Active ; Vocabulary:   SNOMED CT        Seasonal allergies (SNOMED CT  :[id number] )  Name of Problem:   Seasonal allergies ; Recorder:   MCBRIDE, RN, ERIN R; Confirmation:   Confirmed ; Classification:   Patient Stated ; Code:   [id number] ; Contributor System:   PowerChart ; Last Updated:   01/19/2015 9:39 EST ; Life Cycle Date:   01/19/2015 ; Life Cycle Status:  Active ; Vocabulary:   SNOMED CT          Diagnoses(Active)    Delayed wound healing  Date:   10/12/2017 ; Diagnosis Type:   Discharge ; Confirmation:   Confirmed ; Clinical Dx:   Delayed wound healing ; Classification:   Medical ; Clinical Service:   Non-Specified ; Code:   ICD-10-CM ; Probability:   0 ; Diagnosis Code:   T14.8XXD        Procedure History        -    Procedure History   (As Of: 10/13/2017 10:05:46 EDT)     Procedure Dt/Tm:   01/22/2014 ; Anesthesia Minutes:   0 ; Procedure Name:   Extraction of wisdom tooth ; Procedure Minutes:   0 ; Last Reviewed Dt/Tm:   10/13/2017 10:05:22 EDT            Procedure Dt/Tm:   01/13/2013 ; Anesthesia Minutes:   0 ; Procedure Name:   Arthroscopy ; Procedure Minutes:   0 ;  Comments:     01/19/2015 9:40 EST - Lauro Regulus, RN, ERIN R  RIGHT KNEE ; Last Reviewed Dt/Tm:   10/13/2017 10:05:22 EDT            Procedure Dt/Tm:   01/19/2015 13:19:00 EST ; Location:   [facility] OR ; Provider:   LAMB-MD,  JOSHUA H; Anesthesia Type:   General ; :   FERLA-MD,  BRIAN P; Anesthesia Minutes:   0 ; Procedure Name:   Ankle Achilles Tendon Repair (Ankle, Left) ; Procedure Minutes:   33 ; Comments:     01/19/2015 14:45 EST - ZEIF, RN, REBECCA N  auto-populated from documented surgical case ; Clinical Service:   Surgery ; Last Reviewed Dt/Tm:   10/13/2017 10:05:22 EDT            Procedure Dt/Tm:   02/12/2015 16:52:00 EST ; Location:   [facility] OR ; Provider:   LAMB-MD,  JOSHUA H; Anesthesia Type:   General ; :   FERLA-MD,  BRIAN P; Anesthesia Minutes:   0 ; Procedure Name:   Ankle Achilles Tendon Repair (Left) ; Procedure Minutes:   63 ; Comments:     02/12/2015 18:26 EST - WELLS, RN, RON D  auto-populated from documented surgical case ; Clinical Service:   Surgery ; Last Reviewed Dt/Tm:   10/13/2017 10:05:22 EDT            Procedure Dt/Tm:   02/12/2015 16:52:00 EST ; Location:   [facility] OR ; Provider:   LAMB-MD,  JOSHUA H; Anesthesia Type:   General ; :   FERLA-MD,  BRIAN P; Anesthesia Minutes:   0 ; Procedure Name:   Ankle and/or Foot Incision and Drainage (Left) ; Procedure Minutes:   86 ; Comments:     02/12/2015 18:26 EST - WELLS, RN, RON D  auto-populated from documented surgical case ; Clinical Service:   Surgery ; Last Reviewed Dt/Tm:   10/13/2017 10:05:22 EDT            Procedure Dt/Tm:   03/04/2015 13:36:00 EST ; Location:   [facility] OR ; Provider:   Leighton Ruff; Anesthesia Type:   General ; :   Thomes Lolling; Anesthesia Minutes:   0 ; Procedure Name:   Ankle and/or Foot Incision and Drainage ; Procedure Minutes:   1502 ; Comments:     03/04/2015 15:04 EST - DANIEL, RN, MELISSA S  auto-populated from documented surgical case ; Clinical Service:  Surgery ; Last Reviewed Dt/Tm:   10/13/2017 10:05:22 EDT            Procedure  Dt/Tm:   07/26/2017 15:26:00 EDT ; Location:   [facility] ; Provider:   Blair Hailey; Anesthesia Type:   General ; :   PENDARVIS-MD, JR, ALLEN E; Anesthesia Minutes:   0 ; Procedure Name:   Ankle Achilles Tendon Repair (Right) ; Procedure Minutes:   31 ; Comments:     1/61/0960 45:40 EDT - Loma Boston, RN, Elizabeth  auto-populated from documented surgical case ; Clinical Service:   Surgery ; Last Reviewed Dt/Tm:   10/13/2017 10:05:22 EDT            History Confirmation   Problem History Changes PAT :   No   Procedure History Changes PAT :   No   Harrel Lemon, RNEnid Derry - 10/13/2017 10:04 EDT   Anesthesia/Sedation   Opioid Exposure :   Opioid Naive   Harrel Lemon, RN, [place] - 10/13/2017 10:04 EDT   Moderate Sedation History :   No prior sedation for procedure   CANNON, RN, [name] - 10/09/2017 9:21 EDT   Anesthesia History :   Prior general anesthesia   SN - Malignant Hyperthermia :   Denies   Previous Problem with Anesthesia :   None   Pregnancy Status :   Patient denies   Last Menstrual Period :   09/25/2017 EDT   Celine Ahr, RN, [name] - 10/09/2017 8:52 EDT   Bloodless Medicine   Will Patient Accept Blood Transfusion and/or Blood Products :   Yes   CANNON, RN, [name] - 10/09/2017 8:52 EDT   ID Risk Screen Symptoms   Recent Travel History :   No recent travel   TB Symptom Screen :   No symptoms   C. diff Symptom/History ID :   Neither of the above   Bock, RN, [name] - 10/09/2017 8:52 EDT   Social History   Social History   (As Of: 10/13/2017 10:05:46 EDT)   Tobacco:        Never smoker   (Last Updated: 02/10/2015 16:37:45 EST by Gareth Eagle, RN, [place])          Alcohol:        Denies   (Last Updated: 01/19/2015 09:41:36 EST by Lauro Regulus, RN, ERIN R)          Substance Abuse:        Denies   (Last Updated: 07/26/2017 12:21:43 EDT by Marcelline Deist, RN, [place])            Advance Directive   Advance Directive :   Dorthula Rue, RN, [name] - 10/09/2017 8:52 EDT   PAT Patient Instructions   Patient Arrival Time PAT :   10/13/2017 0:00 EDT   (Comment:  PER OFFICE Celine Ahr, RN, [name] - 10/09/2017 9:21 EDT] )   Celine Ahr, RN, [name] - 10/09/2017 9:21 EDT   Medications in AM :   TAKE ZYRTEC   Medication Understanding :   Verbalizes understanding   NPO PAT :   NPO after midnight   CANNON, RN, [name] - 10/09/2017 8:52 EDT   PAT Instructions Grid   Make up Understanding :   Verbalizes understanding   Jewelry Understanding :   Verbalizes understanding   Perfume Understanding :   Verbalizes understanding   Valuables Understanding :   Verbalizes understanding   Clothing Understanding :   Verbalizes understanding   Contact MD for  Illness :   Verbalizes understanding   Contact MD for skin injury :   Verbalizes understanding   CANNON, RN, [name] - 10/09/2017 8:52 EDT   Service Line PAT :   Ortho   Laterality PAT :   Right   Prep PAT :   Hibiclens   Name of Contact PAT :   KATHLEEN Lajeunesse   Relationship of Contact PAT :   MOTHER   Contact Number PAT :   [phone]   Transportation Instructions PAT :   Accompany to [place] with 24 hours post-procedure   [place], RN, [name] - 10/09/2017 8:52 EDT   Harm Screen   Injuries/Abuse/Neglect in Household :   Denies   Feels Unsafe at Home :   No   Suicidal Behavior :   None   Self Harming Behavior :   None   Suicidal Ideation :   None   Harrel Lemon, Government social research officer - 10/13/2017 10:04 EDT

## 2017-10-12 NOTE — Nursing Note (Signed)
Nursing Discharge Summary - Text       Physician Discharge Summary Entered On:  10/12/2017 20:55 EDT    Performed On:  10/12/2017 20:54 EDT by Makaelyn Aponte-PA,  Betti Cruz               DC Information   Provider Instructions for Diet :   A Healthy Diet, Regular diet   Parmvir Boomer-PA,  Emer Onnen S - 10/12/2017 20:54 EDT   Provider Instructions for Activity :   Other: NON WEIGHT BEARING IN BOOT UNTIL WOUND IS FULLY HEALED, ELEVATION AS NEEDED   Shakai Dolley-PA,  Ashiyah Pavlak S - 10/13/2017 13:06 EDT     Provider Instructions for Wound Care :   Keep your dressing clean and dry   Mekia Dipinto-PA,  Hero Kulish S - 10/12/2017 20:54 EDT

## 2017-10-13 NOTE — Case Communication (Signed)
Discharge Follow-Up Form - Text       Discharge Follow-Up Entered On:  10/15/2017 14:13 EDT    Performed On:  10/15/2017 14:12 EDT by Kizzie Bane RN, Lupita Leash               Clinical   Provider Follow-Up Post Discharge RTF :   Follow-Up Appointments    With:  f/u 10-14 days post op, call office for appt/with questions. [phone]       Kizzie Bane RNLupita Leash - 10/15/2017 14:12 EDT   Status   Case Status :   Incomplete   Phone Call History Post DC (Readmit) :   Second call   Kizzie Bane RNLupita Leash - 10/15/2017 14:12 EDT

## 2017-10-13 NOTE — Nursing Note (Signed)
Medication Administration Follow Up-Text       Medication Administration Follow Up Entered On:  10/13/2017 13:47 EDT    Performed On:  10/13/2017 13:26 EDT by Kizzie BaneHughes, RN, Lupita Leashonna      Intervention Information:     ondansetron  Performed by Kizzie BaneHughes, RN, Donna on 10/13/2017 13:11:00 EDT       ondansetron,4mg   IV Push,Hand, Left,nausea/vomiting       Med Response   ED Medication Response :   No adverse reaction, Symptoms improved   Pasero Opioid Induced Sedation Scale :   2 = Slightly drowsy, easily aroused   Merlyn AlbertHughes, RN, Donna - 10/13/2017 13:47 EDT

## 2017-10-13 NOTE — Case Communication (Signed)
Discharge Follow-Up Form - Text       Discharge Follow-Up Entered On:  10/14/2017 12:25 EDT    Performed On:  10/14/2017 12:24 EDT by Dorita Fray, RN, Frederik Schmidt               Clinical   Provider Follow-Up Post Discharge RTF :   Follow-Up Appointments    With:  f/u 10-14 days post op, call office for appt/with questions. [phone]       Dorita Fray RN, Frederik Schmidt - 10/14/2017 12:24 EDT   Status   Case Status :   Incomplete   TCM Call Outcome :   No answer/unable to reach   Morey Hummingbird - 10/14/2017 12:24 EDT

## 2017-10-13 NOTE — Procedures (Signed)
IntraOp Record - [facility]             IntraOp Record - [facility] Summary                                                                   Primary Physician:        Blair Hailey    Case Number:              [phone]    Finalized Date/Time:      10/13/17 14:31:21    Pt. Name:                 Carrie Shields, Carrie Shields    D.O.B./Sex:               01/27/1995    Female    Med Rec #:                [id number]    Physician:                Blair Hailey    Financial #:              [id number]    Pt. Type:                 S    Room/Bed:                 /    Admit/Disch:              10/13/17 09:42:00 -    Institution:       [facility] - Case Times                                                                                                         Entry 1                                                                                                          Patient      In Room Time             10/13/17 12:11:00               Out Room Time                   10/13/17 12:58:00    Anesthesia     Procedure  Start Time               10/13/17 12:32:00               Stop Time                       10/13/17 12:52:00    Last Modified By:         Wardell Heath RN, PENNE L                              10/13/17 12:58:34      [facility] - Case Times Audit                                                                          10/13/17 12:58:34         Owner: ABELPE                               Modifier: ABELPE                                                        <+> 1         Out Room Time     10/13/17 12:55:08         Owner: ABELPE                               Modifier: ABELPE                                                        <+> 1         Stop Time     10/13/17 12:34:08         Owner: ABELPE                               Modifier: ABELPE                                                        <+> 1         Start Time        [facility] - Case Attendance  Entry 1                         Entry 2                         Entry 3                                          Case Attendee             CHOI-MD,  YOUNG                 OHLSON-MD,  BLAKE               ABELS, RN, PENNE L    Role Performed            Anesthesiologist                Surgeon Primary                 Circulator    Time In                   10/13/17 12:11:00               10/13/17 12:11:00               10/13/17 12:11:00    Time Out                  10/13/17 12:58:00               10/13/17 12:58:00               10/13/17 12:58:00    Procedure                 Ankle and/or Foot               Ankle and/or Foot               Ankle and/or Foot                              Incision and                    Incision and                    Incision and                              Drainage(Right)                 Drainage(Right)                 Drainage(Right)    Last Modified By:         Wardell Heath, RN, PENNE L              ABELS, RN, PENNE L              ABELS, RN, PENNE L                              10/13/17 14:29:21  10/13/17 14:29:21               10/13/17 14:29:21                                Entry 4                         Entry 5                         Entry 6                                          Case Attendee             Jacqulyn Ducking, RN, Jones Broom            EDEN-PA,  Renee Rival    Role Performed            Circulator Relief               First Assistant                 Surgical Scrub    Time In                   10/13/17 12:11:00               10/13/17 12:11:00               10/13/17 12:11:00    Time Out                  10/13/17 12:58:00               10/13/17 12:58:00               10/13/17 12:33:00    Procedure                 Ankle and/or Foot               Ankle and/or Foot               Ankle and/or Foot                              Incision and                    Incision and                    Incision and                              Drainage(Right)                  Drainage(Right)                 Drainage(Right)    Last Modified By:         Wardell Heath, RN, PENNE L              ABELS, RN, PENNE L  Wardell Heath RN, Horald Chestnut                              10/13/17 14:29:21               10/13/17 14:29:21               10/13/17 14:29:21                                Entry 7                                                                                                          Case Attendee             [facility], LPN, Eagle F    Role Performed            Surgical Scrub Relief    Time In                   10/13/17 12:11:00    Time Out                  10/13/17 12:33:00    Procedure                 Ankle and/or Foot                              Incision and                              Drainage(Right)    Last Modified By:         Wardell Heath RN, PENNE L                              10/13/17 14:29:21      [facility] - Case Attendance Audit                                                                     10/13/17 14:29:21         Owner: Louis Matte                               Modifier: ABELPE  1     <+> Time Out            1     <*> Procedure                              Ankle and/or Foot Incision and Drainage(Right)            2     <+> Time Out            2     <*> Procedure                              Ankle and/or Foot Incision and Drainage(Right)            3     <+> Time Out            3     <*> Procedure                              Ankle and/or Foot Incision and Drainage(Right)            4     <+> Time Out            4     <*> Procedure                              Ankle and/or Foot Incision and Drainage(Right)            5     <+> Time Out            5     <*> Procedure                              Ankle and/or Foot Incision and Drainage(Right)            6     <*> Procedure                              Ankle and/or Foot Incision and Drainage(Right)            7     <*> Procedure                              Ankle and/or  Foot Incision and Drainage(Right)     10/13/17 12:34:03         Owner: ABELPE                               Modifier: ABELPE                                                            1     <*> Procedure                              Ankle  and/or Foot Incision and Drainage(Right)            2     <*> Procedure                              Ankle and/or Foot Incision and Drainage(Right)            3     <+> Time In            3     <*> Procedure                              Ankle and/or Foot Incision and Drainage(Right)            4     <+> Time In            4     <*> Procedure                              Ankle and/or Foot Incision and Drainage(Right)            5     <+> Time In            5     <*> Procedure                              Ankle and/or Foot Incision and Drainage(Right)            6     <+> Time In            6     <+> Time Out            6     <*> Procedure                              Ankle and/or Foot Incision and Drainage(Right)            7     <+> Time In            7     <+> Time Out            7     <*> Procedure                              Ankle and/or Foot Incision and Drainage(Right)     10/13/17 12:29:34         Owner: ABELPE                               Modifier: ABELPE                                                            1     <+> Time In            1     <*> Procedure  Ankle and/or Foot Incision and Drainage(Right)            2     <+> Time In            2     <*> Procedure                              Ankle and/or Foot Incision and Drainage(Right)        <+> 3         Case Attendee        <+> 3         Role Performed        <+> 3         Procedure        <+> 4         Case Attendee        <+> 4         Role Performed        <+> 4         Procedure        <+> 5         Case Attendee        <+> 5         Role Performed        <+> 5         Procedure        <+> 6         Case Attendee        <+> 6         Role Performed        <+> 6         Procedure         <+> 7         Case Attendee        <+> 7         Role Performed        <+> 7         Procedure        [facility] - Skin Assessment                                                                          Pre-Care Text:            A.240 Assesses baseline skin condition Im.120 Implements protective measures to prevent skin or tissue injury           due to mechanical sources  Im.280.1 Implements progective measures to prevent skin or tissue injury due to           thermal sources Im.360 Monitors for signs and symptons of infection                              Entry 1  Skin Integrity            Intact    Last Modified By:         Wardell Heath, RN, PENNE L                              10/13/17 12:34:20    Post-Care Text:            E.10 Evaluates for signs and symptoms of physical injury to skin and tissue E.270 Evaluate tissue perfusion           O.60 Patient is free from signs and symptoms of injury caused by extraneous objects   O.210 Patinet's tissue           perfusion is consistent with or improved from baseline levels      [facility] - Patient Positioning                                                                      Pre-Care Text:            A.240 Assesses baseline skin condition A.280 Identifies baseline musculoskeletal status A.280.1 Identifies           physical alterations that require additional precautions for procedure-specific positioning A.510.8 Maintains           patient's dignity and privacy Im.120 Implements protective measures to prevent skin/tissue injury due to           mechanical sources Im.40 Positions the patient Im.80 Applies safety devices                              Entry 1                                                                                                          Procedure                 Ankle and/or Foot               Body Position                   Prone                               Incision and                              Drainage(Right)    Left Arm Position         Flexed on Padded Arm            Right Arm Position  Flexed on Padded Arm                              Board w/Security Strap                                          Board w/Security Strap    Left Leg Position         Pillow Under Lower Leg          Right Leg Position              Held on [employer] Uncrossed            Yes                             Pressure Points                 Yes                                                              Checked     Positioning Device        Foam Padding, Safety            Positioned By                   CHOI-MD,  YOUNG,                              Strap, Gel Roll, [employer], RN, [employer],                              Rest Foam                                                       EDEN-PA,  KATE S    Outcome Met (O.80)        Yes    Last Modified ByWardell Heath, RN, PENNE L                              10/13/17 12:36:39    Post-Care Text:            A.240 Assesses baseline skin condition A.280 Identifies baseline musculoskeletal status A.280.1 Identifies           physical alterations that require additional precautions for procedure-specific positioning A.510.8 Maintains           patient's dignity and privacy Im.120 Implements protective measures  to prevent skin/tissue injury due to           mechanical sources Im.40 Positions the patient Im.80 Applies safety devices      [facility] - Skin Prep                                                                                Pre-Care Text:            A.30 Verifies allergies A.20 Verifies procedure, surgical site, and laterality A.510.8 Maintains paritnet's           dignity and privacy Im.270 Performs Skin Preparation Im.270.1 Implements protective measures to prevent skin           and tissue injury due to chemical sources  A.300.1 Protects from cross-contamination                               Entry 1                                                                                                          Hair Removal     Skin Prep      Prep Agents (Im.270)     Chlorhexidine Gluconate         Prep Area (Im.270)              Leg Foot Right                              2% w/Alcohol     Prep Area Details        Right                           Prep By                         EDEN-PA,  KATE S    Outcome Met (O.100)       Yes    Last Modified By:         Wardell Heath, RN, PENNE L                              10/13/17 12:35:02    Post-Care Text:            E.10 Evaluates for signs and symptoms of physical injury to skin and tissue O.100 Patient is free from signs           and symptoms of chemical injury  O.740 The patient's right to privacy is maintained  [facility] - Counts Initial and Final                                                                 Pre-Care Text:            A.20.2 - Assesses the risk for unintended retained surgical items Im.20 - Performs required counts                              Entry 1                                                                                                          Initial Counts      Initial Counts           ABELS, RN, PENNE L,             Items included in               Sponges, Sharps     Performed By             Karmen Bongo                  the Initial Count     Final Counts      Final Counts             Jacqulyn Ducking, RN, Jones Broom,           Final Count Status              Correct     Performed By             [facility], LPN, Rolette F     Items Included in        Sponges, Sharps     Final Count     Outcome Met (O.20)        Yes    Last Modified By:         Wardell Heath, RN, PENNE L                              10/13/17 12:47:24    Post-Care Text:            E.50 - Evaluates results of the surgical count O.20 - Patient is free from unintended retained surgical items      [facility] - Counts Initial and Final Audit                                                             10/13/17  12:47:24         Owner: ABELPE                               Modifier: ABELPE                                                        <+> 1         Final Counts Performed By        <+> 1         Final Count Status        <+> 1         Items Included in Final Count        <+> 1         Outcome Met (O.20)        [facility] - General Case Data                                                                        Pre-Care Text:            A.350.1 Classifies surgical wound                              Entry 1                                                                                                          Case Information      ASA Class                1                               Case Level                      Level 2     OR                       RH1 01                          Specialty                       Orthopedic (SN)     Wound Class              4-Dirty-Infected    Preop Diagnosis  delapyed wound heeling          Postop Diagnosis                DELAYED WOUND HEALING                              right achilles                                                  RIGHT ACHILLES    Last Modified By:         Wardell Heath, RN, PENNE L                              10/13/17 14:28:59    Post-Care Text:            O.760 Patient receives consistent and comparable care regardless of the setting      [facility] - General Case Data Audit                                                                   10/13/17 14:28:59         Owner: ABELPE                               Modifier: ABELPE                                                            1     <*> Postop Diagnosis                       same     10/13/17 12:33:07         Owner: ABELPE                               Modifier: ABELPE                                                        <+> 1         Postop Diagnosis        [facility] - Fire Risk Assessment  Entry 1                                                                                                           Fire Risk                 Alcohol Based Prep              Fire Risk Score                 2    Assessment: If            Solution, Ignition    checked, checkmark        Source In Use    = 1 point     Last Modified By:         ABELS, RN, PENNE L                              10/13/17 12:36:53      [facility] - Time Out                                                                                 Pre-Care Text:            A.10 Confirms patient identity A.20 Verifies operative procedure, surgical site, and laterality A.20.1 Verifies           consent for planned procedure A.30 Verifies allergies                              Entry 1                                                                                                          Procedure                 Ankle and/or Foot               Is everyone ready               Yes  Incision and                    to perform time out                               Drainage(Right)    Have all members of       Yes                             Patient name and                Yes    the surgical team                                         DOB confirmed     been introduced     Allergies discussed       Yes                             Surgical procedure              Yes                                                              to be performed                                                               confirmed and                                                               verified by                                                               completed surgical                                                               consent     Correct surgical          Yes                             Correct laterality  Yes    site marked and                                           confirmed     initials are     visible through     prepped and  draped     field (or     alternative ID band     used)     Correct patient           Yes                             Surgeon shares                  Yes    position confirmed                                        operative plan,                                                               possible                                                               difficulties,                                                               expected duration,                                                               anticipated blood                                                               loss and reviews                                                               all  critical/specific                                                               concerns     Required blood            Yes                             Essential imaging               Yes    products, implants,                                       available and fetal     devices and/or                                            heartones confirmed     special equipment                                         (if applicable)     available and     sterility confirmed     VTE prophylaxis           Yes                             Antibiotics ordered             Yes    addressed                                                 and administered     Anesthesia shares         Yes                             Fire risk                       Yes    anesthetic plan and                                       assessment scored     reviews patient                                           and plan discussed     specific concerns     Appropriate drying        Yes  Surgeon states:                 Yes    time for prep                                             Does anyone have     observed before                                           any concerns? If     draping                                                    you see, suspect,                                                               or feel that                                                               patient care is                                                               being compromised,                                                               speak up for                                                               patient safety     Time Out Complete         10/13/17 12:31:00    Last Modified By:         Wardell Heath, RN, PENNE L                              10/13/17 12:33:00    Post-Care Text:            E.30 Evaluates verification process for correct patient, site, side, and level surgery  [facility] - Debrief                                                                                  Pre-Care Text:            Im.330 Manages specimen handling and disposition                              Entry 1                                                                                                          Procedure                 Ankle and/or Foot               Final counts                    Yes                              Incision and                    correct and                               Drainage(Right)                 verbally verified                                                               with                                                               surgeon/licensed                                                               independent  practitioner (if                                                               applicable)     Actual procedure          Yes                             Postop diagnosis                Yes    performed confirmed                                       confirmed     Wound                     Yes                             Confirm specimens               Yes    classification                                             and specimens     confirmed                                                 labeled                                                               appropriately (if                                                               applicable)     Foley catheter            Yes                             Patient recovery                Yes    removed (if                                               plan confirmed     applicable)     Debrief  Complete          10/13/17 12:47:00    Last Modified By:         Wardell Heath, RN, PENNE L                              10/13/17 12:47:40    Post-Care Text:            E.800 Ensures continuity of care E.50 Evaluates results of the surgical count O.30 Patient's procedure is           performed on the correct site, side, and level O.50 patient's current status is communicated throughout the           continuum of care O.40 Patient's specimen(s) is managed in the appropriate manner      RHOR - Cautery                                                                                  Pre-Care Text:            A.240 Assesses baseline skin condition A280.1 Identifies baseline musculoskeletal status Im.50 Implements           protective measures to prevent injury due to electrical sources  Im.60 Uses supplies and equipment within safe           parameters Im.80 Applies safety devices                              Entry 1                                                                                                          ESU Type                  GENERATOR                       Identification                  [id number]                              [employer]              Number     Coag Setting (watts)      35                              Cut Setting (watts)             35    Grounding  Pad             Yes                             Grounding Pad Site              Thigh, left    Needed?     Grounding [employer], Therapist, sports, IT trainer R            Outcome Met (O.10)               Yes    Applied By     Last Modified By:         Wardell Heath, RN, PENNE L                              10/13/17 12:32:12    Post-Care Text:            E.10 Evaluates for signs and symptoms of physical injury to skin and tissue O.10 Patient is free from signs and           symptoms of injury related to thermal sources  O.70 Patient is free from signs and symptoms of electrical injury      [facility] - Patient Care Devices                                                                     Pre-Care Text:            A.200 Assesses risk for normothermia regulation A.40 Verifies presence of prosthetics or corrective devices           Im.9 Implements thermoregulation measures Im.60 Uses supplies and equipment within safe parameters                              Entry 1                                                                                                          Equipment Type            MACHINE SEQUENTIAL              SCD Sleeve Site                 Leg Left                              COMPRESSION    Equipment/Tag Number      [id number]  Initiated Pre                   Yes                                                              Induction     Last Modified By:         Wardell Heath, RN, PENNE L                              10/13/17 12:40:35    Post-Care Text:            E.10 Evaluates signs and symptoms of physical injury to skin and tissue O.60 Patient is free from signs and           symptoms of injury caused by extraneous objects      [facility] - Medications                                                                              Pre-Care Text:            A.10 Confirms patient identity A.30 Verifies allergies Im.220 Administers prescribed medications Im.220.2           Administers prescribed antibiotic therapy as ordered                              Entry 1                         Entry 2                         Entry 3                                          Time Administered         10/13/17  12:32:00               10/13/17 12:45:00               10/13/17 12:45:00    Medication                ROPIVACAINE HCL                 GENTAMYCIN 80MG                 BACITRACIN INJECTION                              INJECTION 5MG/ML  30 ML  50KU    Route of Admin            Local Injection                 Irrigation                      Irrigation    Dose/Volume               34m                            815m                           50000u    (include amount and     unit of measure)     Site                      Foot                            Foot                            Foot    Site Detail               Right                           Right                           Right    Administered By           OHLSON-MD,  BLAKE               OHLSON-MD,  BLAKE               OHLSON-MD,  BLAKE    Outcome Met (O.130)       Yes                             Yes                             Yes    Last Modified By:         ABWardell HeathRN, PENNE L              ABELS, RN, PENNE L              ABELS, RN, PENNE L                              10/13/17 14:30:26               10/13/17 14:30:26               10/13/17 14:30:26                                Entry 4  Time Administered         10/13/17 12:49:00    Medication                VANCOMYCIN HCL 1GM INJ    Route of Admin            Topical    Dose/Volume               1gm    (include amount and     unit of measure)     Site                      Foot    Site Detail               Right    Administered By           Blair Hailey    Outcome Met (O.130)       Yes    Last Modified By:         Wardell Heath, RN, PENNE L                              10/13/17 14:30:26    Post-Care Text:            E.20 Evaluates response to medications O.130 Patient receives appropriately administerd medication(s)      [facility] - Medications Audit                                                                          10/13/17 14:30:26         Owner: ABELPE                               Modifier: ABELPE                                                            1     <*> Medication                             ROPIVACAINE HCL INJECTION 5MG/ML  30 ML            1     <+> Time Administered            2     <*> Medication                             GENTAMYCIN 80MG            2     <+> Time Administered            3     <*> Medication                             BACITRACIN INJECTION  50KU            3     <+> Time Administered            4     <*> Medication                             VANCOMYCIN HCL 1GM INJ            4     <+> Time Administered     10/13/17 12:49:29         Owner: ABELPE                               Modifier: ABELPE                                                            1     <*> Medication                             ROPIVACAINE HCL INJECTION 5MG/ML  30 ML            1     <*> Dose/Volume (include amount and        75m                      unit of measure)     10/13/17 12:41:12         Owner: ABELPE                               Modifier: ABELPE                                                        <+> 4         Medication        <+> 4         Route of Admin        <+> 4         Administered By        <+> 4         Dose/Volume (include amount and                      unit of measure)        <+> 4         Outcome Met (O.130)        <+> 4         Site        <+> 4         Site Detail        [facility] - Cultures  Pre-Care Text:            A.20 Verifies operative procedure, surgical site, and laterality Im.320 Manages culture specimen collection           Im.330 Manages specimen handling and disposition                              Entry 1                                                                                                          Cultures Ordered          Yes    Last Modified By:          Wardell Heath, RN, PENNE L                              10/13/17 14:30:57    Post-Care Text:            E.40 Evaluates correct processes have been performed for specimen handling and disposition O.40 Patient's           specimen(s) is managed in the appropriate manner      [facility] - Dressing/Packing                                                                         Pre-Care Text:            A.350 Assesses susceptibility for infection Im.250 Administers care to invasive devices Im.290 Administer care           to wound sites  Im.300 Implements aseptic technique                              Entry 1                                                                                                          Site                      Foot                            Site Details  Right    Dressing Item     Details      Dressing Item            Medicated Gauze, 4x4's,         Cast/Splint (Im.290)            Cast Padding     (Im.290)                 Self Adherent Wrap    Last Modified By:         Wardell Heath, RN, PENNE L                              10/13/17 12:47:04    Post-Care Text:            E.320 Evaluate factors associted with increased risk for postoperative infection at the completion of the           procedure O.200 Patient's wound perfusion is consistent with or improved from baseline levels  O.Patient is           free from signs and symptoms of infection      [facility] - Procedures                                                                               Pre-Care Text:            A.20 Verifies operative procedure, surgical site, and laterality Im.150 Develops individualized plan of care                              Entry 1                                                                                                          Procedure     Description      Procedure                Ankle and/or Foot               Modifiers                       Right                              Incision and Drainage      Surgical Procedure       RIGHT ACHILLES I&D     Text     Primary Procedure         Yes  Primary Surgeon                 Blair Hailey    Start                     10/13/17 12:32:00               Stop                            10/13/17 12:52:00    Anesthesia Type           General                         Surgical Service                Orthopedic (SN)    Wound Class               4-Dirty-Infected    Last Modified By:         Wardell Heath, RN, PENNE L                              10/13/17 12:55:09    Post-Care Text:            O.730 The patinet's care is consistent with the individualized perioperative plan of care      [facility] - Transfer                                                                                                           Entry 1                                                                                                          Transferred By            CHOI-MD,  Annamaria Boots,                Via                             Richardson Dopp, RN, Jones Broom    Post-op Destination       PACU    Skin Assessment      Condition  Intact    Last Modified By:         Wardell Heath, RN, PENNE L                              10/13/17 12:48:00      Case Comments                                                                                         <None>              Finalized By: Wardell Heath, RN, PENNE L      Document Signatures                                                                             Signed By:           Wardell Heath RN, PENNE L 10/13/17 13:00          ABELS, RN, PENNE L 10/13/17 14:31      Unfinalized History                                                                                     Date/Time            Username    Reason for Unfinalizing         Freetext Reason for Unfinalizing                                          10/13/17 14:28       ABELPE      Documented in Error

## 2017-10-13 NOTE — Discharge Summary (Signed)
 Inpatient Clinical Summary             [facility]  Post-Acute Care Transfer Instructions  PERSON INFORMATION   Name: Carrie Shields, Carrie Shields   MRN: [id number]    FIN#: [id number]   PHYSICIANS  Admitting Physician: DELIAH MAFFUCCI  Attending Physician: DELIAH MAFFUCCI   PCP: JEANNETTE DOUGLAS NORLEEN VICENTA  Discharge Diagnosis: Delayed wound healing  Comment:       PATIENT EDUCATION INFORMATION  Instructions:             Using Crutches (Non-Weight-Bearing), Discharge Instructions; Step-by-Step: Using Crutches with Swing Through (Non-Weight Bearing); Using Crutches: Non-Weight-Bearing; Anesthesia: After Your Surgery (JEFFCO) (JEFFCO)  Medication Leaflets:               Follow-up:                           With: Address: When:   f/u 10-14 days post op, call office for appt/with questions. [phone]                               MEDICATION LIST  Medication Reconciliation at Discharge:          New Medications  Other Medications  HYDROcodone-acetaminophen  (Norco 325 mg-7.5 mg oral tablet) 1 Tabs Oral (given by mouth) every 6 hours as needed as needed for pain. Refills: 0.  Last Dose:____________________  Medications That Were Updated - Follow Current Instructions  Printed Prescriptions  Current: minocycline (minocycline 100 mg oral capsule) 1 Capsules Oral (given by mouth) 2 times a day for 14 Days. Refills: 1.  Last Dose:____________________    Medications That Have Not Changed  Other Medications  cetirizine (ZyrTEC 10 mg oral tablet) 1 Tabs Oral (given by mouth) every day as needed as needed for allergy symptoms.  Last Dose:____________________  ibuprofen 800 Milligram Oral (given by mouth) 3 times a day as needed mild pain (1-3).  Last Dose:____________________  Misc Rx Supply (Antibiotic ointment) 1 application Topical (on the skin) every day. right foot daily.  Last Dose:____________________  norgestimate-ethinyl estradiol (Sprintec 0.25 mg-35 mcg oral tablet) 1 Tabs Oral (given by mouth) every day.  Last  Dose:____________________         Patient's Final Home Medication List Upon Discharge:           cetirizine (ZyrTEC 10 mg oral tablet) 1 Tabs Oral (given by mouth) every day as needed as needed for allergy symptoms.  HYDROcodone-acetaminophen  (Norco 325 mg-7.5 mg oral tablet) 1 Tabs Oral (given by mouth) every 6 hours as needed as needed for pain. Refills: 0.  ibuprofen 800 Milligram Oral (given by mouth) 3 times a day as needed mild pain (1-3).  minocycline (minocycline 100 mg oral capsule) 1 Capsules Oral (given by mouth) 2 times a day for 14 Days. Refills: 1.  Misc Rx Supply (Antibiotic ointment) 1 application Topical (on the skin) every day. right foot daily.  norgestimate-ethinyl estradiol (Sprintec 0.25 mg-35 mcg oral tablet) 1 Tabs Oral (given by mouth) every day.         Comment:       ORDERS          Order Name Order Details   Discharge Patient 10/13/17 13:06:00 EDT, Discharge Home/Self Care

## 2017-10-13 NOTE — Op Note (Signed)
 Operative Report    [facility]  Feliciano Madeira, MD  Service Date: 10/13/2017    PREOPERATIVE DIAGNOSIS:  Delayed wound healing right Achilles.    POSTOPERATIVE DIAGNOSIS:  Delayed wound healing right Achilles.    PROCEDURE PERFORMED:  Excisional debridement of skin, subcutaneous  tissue and tendon right Achilles along with procurement of cultures,  aerobic, anaerobic, fungal, Mycobacterium followed by placement of  antibiotic powder.    SURGEON:  Feliciano Madeira, MD    ASSISTANT:  Mallie Car, PA.  PA was medically necessary to assist with  retraction of soft tissues, protection of sural nerve, assistance with  wound closure and dressing application.    ANESTHESIA:  Dr. Rojelio:  General.    ESTIMATED BLOOD LOSS:  Approximately 10 mL    TOURNIQUET:  None utilized.    ANTIBIOTICS:  Given prior to incision.  Informed consent obtained,  surgical site signed.  All sponge counts were correct.    DISPOSITION:  To recovery unit.    INDICATIONS FOR PROCEDURE:  The patient has a history of Achilles  repair was felt in an area of intermittent drainage distal to her  incision.  Questionable infection.  She has responded to antibiotics  but keeps developing this small draining sinus there may be an area of  delayed wound healing and also may indicate an area of indolent ____  infection.  She has history of undergoing Achilles repair on the  contralateral side and developed a significant infection on that side  has undergone multiple debridements.  Talked about the risks including  tendon rupture, persistent infection, wound complication,  neurovascular damage, need for additional surgery.  All questions were  answered.  She wished to proceed.    OPERATIVE DETAILS:  She was brought to the operating room,  administered general anesthesia, placed prone on the operating table.   Bony prominences were padded appropriately.  Right lower extremity  prepped and draped in normal sterile fashion.  Previous incision was  utilized.  A small  draining sinus was ellipsed carried all the way  down to the tendon.  There is purulence that extended at least a  centimeter in either direction on the small 5 mm draining sinus and so  we had to enlarge the incision to cover this entire area.  The  proximal area  extended closer to 2 cm.  We took it up into healthy  margins and then a full aggressive debridement was performed of skin,  subcutaneous tissue and tendon, removal of any purulent material and  final removal of nonabsorbable suture material that seemed to be right  in the middle of this problem.  There was a long strand of FiberWire  this is a braided nonabsorbable suture.  Once this was completed, it  was irrigated with antibiotic irrigating solution and packed with  vancomycin powder which was laid on top of the tendon just to provide  some local effect bacteria that has responded minocycline and then we  closed with interrupted nylon.  She was placed in a soft compressive  dressing and returned to recovery in stable condition.  Cultures were  procured, anaerobic, aerobic, fungal Mycobacterium.      Feliciano Madeira, MD  TR: mn DD: 10/13/2017 12:47 TD: 10/13/2017 13:05 Job#: [id number]  \[id number]\DOC#: [id number]  \[id number]\  Signature Line    Electronically Signed on 10/29/2017 11:12 AM EDT  ________________________________________________  DELIAH FELICIANO

## 2017-10-13 NOTE — Nursing Note (Signed)
Medication Administration Follow Up-Text       Medication Administration Follow Up Entered On:  10/13/2017 13:47 EDT    Performed On:  10/13/2017 13:37 EDT by Kizzie BaneHughes, RN, Lupita Leashonna      Intervention Information:     hydromorphone  Performed by Kizzie BaneHughes, RN, Donna on 10/13/2017 13:22:00 EDT       hydromorphone,0.5mg   IV Push,Hand, Left,other (see comment)       Med Response   ED Medication Response :   No adverse reaction, Symptoms improved   Numeric Rating Pain Scale :   5 = Moderate pain   Pasero Opioid Induced Sedation Scale :   2 = Slightly drowsy, easily aroused   Merlyn AlbertHughes, RN, Donna - 10/13/2017 13:47 EDT

## 2017-10-13 NOTE — Nursing Note (Signed)
Medication Administration Follow Up-Text       Medication Administration Follow Up Entered On:  10/13/2017 16:09 EDT    Performed On:  10/13/2017 16:05 EDT by Kizzie BaneHughes, RN, Lupita Leashonna      Intervention Information:     acetaminophen-hydrocodone  Performed by Kizzie BaneHughes, RN, Lupita Leashonna on 10/13/2017 15:05:00 EDT       HYDROcodone-acetaminophen,1tabs  Oral,moderate pain (4-7)       Med Response   ED Medication Response :   Medication administered at discharge   Pasero Opioid Induced Sedation Scale :   1 = Awake and alert   Merlyn AlbertHughes, RN, Donna - 10/13/2017 16:08 EDT

## 2017-10-13 NOTE — Discharge Summary (Signed)
Inpatient Patient Summary               [facility]  [address]  [place], [place] [address]  [phone]  Patient Discharge Instructions    Name: Carrie Shields, Carrie Shields  Current Date: 10/13/2017 14:50:29  DOB: May 20, 1995 MRN: [id number] FIN: [id number]%>[id number]  Patient Address: [address] [place] [address]  Patient Phone: [phone]  Primary Care Provider:  Name: Loanne Drilling  Phone: [phone]   Immunizations Provided:      Discharge Diagnosis: Delayed wound healing  Discharged To: TO, ANTICIPATED%>  Home Treatments: TREATMENTS, ANTICIPATED%>  Devices/Equipment: EQUIPMENT REHAB%>  Post Hospital Services: HOSPITAL SERVICES%>  Professional Skilled Services: SKILLED SERVICES%>  Therapist, sports and Community Resources: SERV AND COMM RES, ANTICIPATED%>  Mode of Discharge Transportation: TRANSPORTATION%>  Discharge Orders          Discharge Patient 10/13/17 13:06:00 EDT, Discharge Home/Self Care        Comment:     Medications   During the course of your visit, your medication list was updated with the most current information. The details of those changes are reflected below:          New Medications  Other Medications  HYDROcodone-acetaminophen (Norco 325 mg-7.5 mg oral tablet) 1 Tabs Oral (given by mouth) every 6 hours as needed as needed for pain. Refills: 0.  Last Dose:____________________  Medications That Were Updated - Follow Current Instructions  Printed Prescriptions  Current: minocycline (minocycline 100 mg oral capsule) 1 Capsules Oral (given by mouth) 2 times a day for 14 Days. Refills: 1.  Last Dose:____________________    Medications That Have Not Changed  Other Medications  cetirizine (ZyrTEC 10 mg oral tablet) 1 Tabs Oral (given by mouth) every day as needed as needed for allergy symptoms.  Last Dose:____________________  ibuprofen 800 Milligram Oral (given by mouth) 3 times a day as needed mild pain (1-3).  Last Dose:____________________  Misc Rx Supply  (Antibiotic ointment) 1 application Topical (on the skin) every day. right foot daily.  Last Dose:____________________  norgestimate-ethinyl estradiol (Sprintec 0.25 mg-35 mcg oral tablet) 1 Tabs Oral (given by mouth) every day.  Last Dose:____________________        [facility] would like to thank you for allowing [place] to assist you with your healthcare needs. The following includes patient education materials and information regarding your injury/illness.    Jeanie Sewer L has been given the following list of follow-up instructions, prescriptions, and patient education materials:  Follow-up Instructions:              With: Address: When:   f/u 10-14 days post op, call office for appt/with questions. [phone]                     It is important to always keep an active list of medications available so that you can share with other providers and manage your medications appropriately. As an additional courtesy, we are also providing you with your final active medications list that you can keep with you.           cetirizine (ZyrTEC 10 mg oral tablet) 1 Tabs Oral (given by mouth) every day as needed as needed for allergy symptoms.  HYDROcodone-acetaminophen (Norco 325 mg-7.5 mg oral tablet) 1 Tabs Oral (given by mouth) every 6 hours as needed as needed for pain. Refills: 0.  ibuprofen 800 Milligram Oral (given by mouth) 3 times a day as needed mild pain (1-3).  minocycline (minocycline 100 mg oral capsule) 1 Capsules Oral (given by mouth) 2 times a day for 14 Days. Refills: 1.  Misc Rx Supply (Antibiotic ointment) 1 application Topical (on the skin) every day. right foot daily.  norgestimate-ethinyl estradiol (Sprintec 0.25 mg-35 mcg oral tablet) 1 Tabs Oral (given by mouth) every day.      Take only the medications listed above. Contact your doctor prior to taking any medications not on this list.      Discharge instructions, if any, will display below    Instructions for Diet: INSTRUCTIONS FOR DIET%>A  Healthy Diet, Regular diet   Instructions for Supplements: SUPPLEMENT INSTRUCTIONS%>   Instructions for Activity: INSTRUCTIONS FOR ACTIVITY%>Other: NON WEIGHT BEARING IN BOOT UNTIL WOUND IS FULLY HEALED, ELEVATION AS NEEDED   Instructions for Wound Care: INSTRUCTIONS FOR WOUND CARE%> Keep your dressing clean and dry    Medication leaflets, if any, will display below     Patient education materials, if any, will display below        Discharge Instructions: Using Crutches (Non-Weight-Bearing)   Your healthcare provider has prescribed crutches for you. A healthy leg can support your body weight, but when you have an injured leg or foot, you need to keep weight off it. The swing to method of walking, sometimes called gait, is easy to learn and takes less arm strength and balance. The swing through gait takes more practice, but it moves you farther with each step and is less tiring overall. Start with swing to and progress to swing through when instructed.        Before you use crutches   Be prepared:    Remove throw rugs, electrical cords, and anything else that may cause you to fall.    Arrange your household to keep the items you need handy. Keep everything else out of the way.    Find a backpack, fanny pack, or apron, or use pockets to carry things. This will help you keep your hands free.   Standing with crutches   Use the balanced standing (tripod) position when you start or end a movement. Also use it whenever youre standing for a length of time.    Move your crutches in front of you about 12 inches.    Hold the injured (or weaker) foot off the floor.    Find your balance.    Be sure not to rest your armpits on the pads of the crutches.   Walking with crutches   Tips include the following:     Start in a balanced standing (tripod) position.    Squeeze the crutch pads against the sides of your chest.    The bottom tips of the crutches should be wide enough apart for you to move easily between them.     Support your weight on your hands, not on your armpits.   Swing to gait    With your crutches in front of you, press down on the handgrips.    Lift your good (stronger) foot and swing your body up to the crutches.    Land on your good foot, between your crutches.    Keep the knee of your injured or weaker foot slightly bent.    Reach forward and out with the crutches to begin the next step.   Swing through gait    With your crutches in front of you, press down on the handgrips.    Lift your good (stronger) foot and swing your body through  the crutches.    Land on your good foot, about 12 inches in front of the crutches.    Keep the knee of your injured leg slightly bent.    Reach forward and out with the crutches to begin the next step.   Follow-up   Make a follow-up appointment as directed by your healthcare provider.       When to call your healthcare provider   Call your healthcare provider right away if you have any of the following:    Sudden or increased shortness of breath    Sudden chest pain    Fever above 100.33F (38.0C)    Increasing redness, tenderness, or swelling at the incision site or in the injured limb    Drainage from the incision or injured limb    Opening of the incision or injury    Localized chest pain with coughing      2000-2017 [employer] [address] [place], [place] [address]. All rights reserved. This information is not intended as a substitute for professional medical care. Always follow your healthcare professional's instructions.         Step-by-Step   Using Crutches with Swing Through (Non-Weight Bearing)         2000-2017 [employer] [address], [place], [place] [address]. All rights reserved. This information is not intended as a substitute for professional medical care. Always follow your healthcare professional's instructions.         Using Crutches: Non-Weight-Bearing   A healthy leg can bear your body weight. But when  you have an injured leg or foot, you need to keep weight off it. The swing to gait is easy to learn and takes less arm strength and balance. The swing through gait takes more practice, but it moves you farther with each step and is less tiring in the long run. Start with swing to, and progress to swing through when instructed.   Balanced standing (tripod) position   Use this position when you start or end a movement. Also, use it whenever youre standing for any length of time. Move your crutches in front of you about 12 inches. Hold the affected foot off the floor. Find your balance. Be sure not to rest your armpits on the pads.   Walking with crutches      Swing to    Start in a balanced standing (tripod) position.    Squeeze the pads against the sides of your chest.    The tips should be wide enough apart for you to move easily between them.    Support your weight on your hands.    Press down on the handgrips.    Lift your unaffected foot and swing your body up to the crutches.    Land on your unaffected foot, between your crutches.    Keep your unaffected knee slightly bent.    Reach forward and out with the crutches to begin the next step.      Swing through    Start in a balanced standing (tripod) position.    Squeeze the pads against the sides of your chest.    The tips should be wide enough apart for you to move easily between them.    Support your weight on your hands.    Press down on the handgrips.    Lift your unaffected foot and swing your body through the crutches.    Land on your unaffected foot, about  12 inches in front of the crutches.    Keep your unaffected knee slightly bent.    Reach forward and out with the crutches to begin the next step.      2000-2017 [employer] [address] [place], [place] [address]. All rights reserved. This information is not intended as a substitute for professional medical care. Always follow your healthcare professional's  instructions.                Discharge Instructions: After Your Surgery  You've just had surgery. During surgery you were given medicine called anesthesia to keep you relaxed and free of pain. After surgery you may have some pain or nausea. This is common. Here are some tips for feeling better and getting well after surgery.        Going home  Your doctor or nurse will show you how to take care of yourself when you go home. He or she will also answer your questions. Have an adult family member or friend drive you home. For the first 24 hours after your surgery:   Do not drive or use heavy equipment.   Do not make important decisions or sign legal papers.   Do not drink alcohol.   Have someone stay with you, if needed. He or she can watch for problems and help keep you safe.   In case of emergency call 911  Be sure to go to all follow-up visits with your doctor. And rest after your surgery for as long as your doctor tells you to.  Coping with pain  If you have pain after surgery, pain medicine will help you feel better. Take it as told, before pain becomes severe. Also, ask your doctor or pharmacist about other ways to control pain. This might be with heat, ice, or relaxation. And follow any other instructions your surgeon or nurse gives you.  Tips for taking pain medicine  To get the best relief possible, remember these points:   Pain medicines can upset your stomach. Taking them with a little food may help.   Most pain relievers taken by mouth need at least 20 to 30 minutes to start to work.   Taking medicine on a schedule can help you remember to take it. Try to time your medicine so that you can take it before starting an activity. This might be before you get dressed, go for a walk, or sit down for dinner.   Constipation is a common side effect of pain medicines. Call your doctor before taking any medicines such as laxatives or stool softeners to help ease constipation. Also ask if you should skip any  foods. Drinking lots of fluids and eating foods such as fruits and vegetables that are high in fiber can also help. Remember, do not take laxatives unless your surgeon has prescribed them.   Drinking alcohol and taking pain medicine can cause dizziness and slow your breathing. It can even be deadly. Do not drink alcohol while taking pain medicine.   Pain medicine can make you react more slowly to things. Do not drive or run machinery while taking pain medicine.  Managing nausea  Some people have an upset stomach after surgery. This is often because of anesthesia, pain, or pain medicine, or the stress of surgery. These tips will help you handle nausea and eat healthy foods as you get better. If you were on a special food plan before surgery, ask your doctor if you should follow it  while you get better. These tips may help:   Do not push yourself to eat. Your body will tell you when to eat and how much.   Start off with clear liquids and soup. They are easier to digest.   Next try semi-solid foods, such as mashed potatoes, applesauce, and gelatin, as you feel ready.   Slowly move to solid foods. Don't eat fatty, rich, or spicy foods at first.   Do not force yourself to have 3 large meals a day. Instead eat smaller amounts more often.   Take pain medicines with a small amount of solid food, such as crackers or toast, to avoid nausea.     Call your surgeon if.   You still have intollerable pain an hour after taking medicine. The medicine may not be strong enough.   You feel too sleepy, dizzy, or groggy. The medicine may be too strong.   You have side effects like nausea, vomiting, or skin changes, such as rash, itching, or hives.      If you have obstructive sleep apnea  You were given anesthesia medicine during surgery to keep you comfortable and free of pain. After surgery, you may have more apnea spells because of this medicine and other medicines you were given. The spells may last longer than usual.   At  home:   Keep using the continuous positive airway pressure (CPAP) device when you sleep. Unless your health care provider tells you not to, use it when you sleep, day or night. CPAP is a common device used to treat obstructive sleep apnea.   Talk with your provider before taking any pain medicine, muscle relaxants, or sedatives. Your provider will tell you about the possible dangers of taking these medicines.     [address] [employer] [address], [place], [place] [address]. All rights reserved. This information is not intended as a substitute for professional medical care. Always follow your healthcare professional's instructions.              IS IT A STROKE? Act FAST and Check for these signs:    FACE                         Does the face look uneven?    ARM                         Does one arm drift down?    SPEECH                    Does their speech sound strange?    TIME                         Call 9-1-1 at any sign of stroke  Heart Attack Signs  Chest discomfort: Most heart attacks involve discomfort in the center of the chest and lasts more than a few minutes, or goes away and comes back. It can feel like uncomfortable pressure, squeezing, fullness or pain.  Discomfort in upper body: Symptoms can include pain or discomfort in one or both arms, back, neck, jaw or stomach.  Shortness of breath: With or without discomfort.  Other signs: Breaking out in a cold sweat, nausea, or lightheaded.  Remember, MINUTES DO MATTER. If you experience any of these heart attack warning signs, call 9-1-1 to get immediate medical attention!     ---------------------------------------------------------------------------------------------------------------------  [facility] allows you to manage your health, view your test results, and retrieve your discharge documents from your hospital stay securely and conveniently from your computer.  To begin the enrollment process, visit [email or web address]  Click on "Sign up now" under [facility].

## 2017-10-19 LAB — CULTURE, TISSUE (WITH GRAM STAIN)

## 2017-10-19 LAB — CULTURE, TISSUE

## 2018-06-14 NOTE — ED Notes (Signed)
 ED Patient Summary                 [facility]  [address], [place], [place] [address]  [phone]  Discharge Instructions (Patient)  _______________________________________     Carrie Shields  DOB:  [birth year]                   MRN: [id number]                   FIN: [phone]  Reason For Visit: Headache; Dizziness; Headache; HTN - Hypertension; HIGH BP  Final Diagnosis: Headache; Papilledema, both eyes     Visit Date: 06/14/2018 14:26:00  Address: [address] [place] [address]  Phone: [phone]     Primary Care Provider:      Name: JEANNETTE DOUGLAS NORLEEN VICENTA      Phone: [phone]        Emergency Department Providers:         Primary Physician:            [facility] would like to thank you for allowing us  to assist you with your healthcare needs. The following includes patient education materials and information regarding your injury/illness.     Follow-up Instructions: You were treated today on an emergency basis, it may be wise to contact your primary care provider to notify them of your visit today. You may have been referred to your regular doctor or a specialist, please follow up as instructed. If your condition worsens or you can't get in to see the doctor, contact the Emergency Department.              With: Address: When:   [facility] [address], [place], [place] [address]  [phone] Business (1) Jun/02/2018 10:00 AM   Comments:   Return to ED if symptoms worsen including worsening with visual changes, headaches, dizziness, vomiting or other concerns.   Your appointment is with Dr. Vicci              Printed Prescriptions:    Patient Education Materials:  Discharge Orders          Discharge Patient 06/14/18 16:30:00 EDT         Comment:             Allergy Info: Augmentin; ertapenem; penicillin     Medication Information:  [facility]  provided you with a complete list of medications post discharge, if you have been instructed to stop taking a medication please ensure you also follow up with this information to your Primary Care Physician.  Unless otherwise noted, patient will continue to take medications as prescribed prior to the Emergency Room visit.  Any specific questions regarding your chronic medications and dosages should be discussed with your physician(s) and pharmacist.          acetaminophen /aspirin/caffeine (Excedrin Migraine) Oral (given by mouth) every 6 hours., MAX DAILY DOSE OF ACETAMINOPHEN  = 4000 MG  cetirizine (ZyrTEC 10 mg oral tablet) 1 Tabs Oral (given by mouth) every day as needed as needed for allergy symptoms.  escitalopram (Lexapro 20 mg oral tablet) 1 Tabs Oral (given by mouth) every day.  norgestimate-ethinyl estradiol (Sprintec 0.25 mg-35 mcg oral tablet) 1 Tabs Oral (given by mouth) every day.      Medications Administered During Visit:  Medication Dose Route   acetaminophen  1000 mg Oral          Major Tests and Procedures:  The following procedures and tests were performed during your ED visit.  PROCEDURES%>  PROCEDURES COMMENTS%>          Laboratory Orders  No laboratory orders were placed.              Radiology Orders  Name Status Details   CT Head or Brain w/o Contrast Completed 06/14/18 14:49:00 EDT, STAT 1 hour or less, Reason: ACR Select, Reason: Headache, focal deficit or papilledema, Transport Mode: STRETCHER, Rad Type, pp_set_radiology_subspecialty, [id number], 7               Patient Care Orders  Name Status Details   Discharge Patient Ordered 06/14/18 16:30:00 EDT   ED Assessment Adult Completed 06/14/18 14:35:13 EDT, 06/14/18 14:35:13 EDT   ED Secondary Triage Completed 06/14/18 14:35:13 EDT, 06/14/18 14:35:13 EDT   ED Triage Adult Completed 06/14/18 14:26:50 EDT, 06/14/18 14:26:50 EDT        ---------------------------------------------------------------------------------------------------------------------  [facility] allows you to manage your health, view your test results, and retrieve your discharge documents from your hospital stay securely and conveniently from your computer.     To begin the enrollment process, visit [email or web address] Click on "Sign up now" under [facility].   Comment:

## 2018-06-14 NOTE — ED Provider Notes (Signed)
Headache        Patient:   Carrie Shields, Carrie Shields            MRN: [id number]            FIN: [id number]               Age:   89 years     Sex:  Female     DOB:  04/30/1995   Associated Diagnoses:   Papilledema, both eyes; Headache   Author:   Oren Shields,  Carrie Shields      Basic Information   Time seen: Provider Seen (ST)   ED Provider/Time:    Carrie Shields,  Carrie Shields / 06/14/2018 14:32  .   Additional information: Chief Complaint from Nursing Triage Note   Chief Complaint  Chief Complaint: pt reports while at optomitrist she was noted to have inflammed optical nerves. reports HA with dizziness during position changes and intermittent blurred vision. denies hx HTN (06/14/18 14:31:00).      History of Present Illness   This is a pleasant [age] year old female presenting today from the optometrist office with a "inflamed optic nerve" bilaterally.  Patient is with her mother today.  They report that she went to see the optometrist that she has had worsening blurry vision.  She does have glasses but states she does not typically wear them.  She is also had intermittent headaches with dizziness, mostly with positional changes and sometimes states loss of vision when bending over.  She also reports loss in peripheral vision and sometimes feels as though the room is spinning.  While at the optometrist today they also reported a elevated blood pressure of 180/100.  She currently reports a mild headache with blurry vision.  No dizziness.  She also sees a neurologist for migraines.  She typically uses Excedrin or Phenergan as needed.  No other medical history.  No recent falls..        Review of Systems   Constitutional symptoms:  No fever, no chills, no sweats.    Skin symptoms:  No rash,    Respiratory symptoms:  No shortness of breath,    Cardiovascular symptoms:  No chest pain,    Gastrointestinal symptoms:  No abdominal pain,              Additional review of systems information: All other systems reviewed and otherwise negative.       Health Status   Allergies:    Allergic Reactions (All)  Moderate  Penicillin- Rash.  Unknown  Augmentin- Hives / itching.  Ertapenem- Hives / itching.  Canceled/Inactive Reactions (All)  Unknown  INVanz- Hives / itching.  No Known Allergies.   Medications:  (Selected)   Inpatient Medications  Ordered  Tylenol: 1,000 mg, 2 tabs, Oral, Once  Documented Medications  Documented  Excedrin Migraine: Oral, q6hr, 0 Refill(s)  Lexapro 20 mg oral tablet: 20 mg, 1 tabs, Oral, Daily, 30 tabs, 0 Refill(s)  Sprintec 0.25 mg-35 mcg oral tablet: 1 tabs, Oral, Daily, 28 tabs, 0 Refill(s)  ZyrTEC 10 mg oral tablet: 10 mg, 1 tabs, Oral, Daily, PRN: as needed for allergy symptoms, 90 tabs, 0 Refill(s).      Past Medical/ Family/ Social History   Medical history: Reviewed as documented in chart.   Surgical history: Reviewed as documented in chart.   Family history: Not significant.   Social history: Reviewed as documented in chart.   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      Physical Examination               Vital Signs   Vital Signs   06/14/2018 14:31 EDT Systolic Blood Pressure 140 mmHg    Diastolic Blood Pressure 78 mmHg    Temperature Oral 37.5 degC  HI    Heart Rate Monitored 78 bpm    Respiratory Rate 15 br/min    SpO2 98 %    Measurements   06/14/2018 14:35 EDT Body Mass Index est meas 39.73 kg/m2    Body Mass Index Measured 39.73 kg/m2   06/14/2018 14:31 EDT Height/Length Measured 182 cm    Weight Dosing 131.6 kg    General:  Alert, no acute distress.    Skin:  Warm, dry, intact.    Head:  Normocephalic, atraumatic.    Neck:  Supple, trachea midline.    Eye:  Pupils are equal, round and reactive to light, extraocular movements are intact, normal conjunctiva.    Cardiovascular:  Regular rate and rhythm, No murmur.    Respiratory:  Lungs are clear to auscultation,  respirations are non-labored, breath sounds are equal, Symmetrical chest wall expansion.    Neurological:  Alert and oriented to person, place, time, and situation, CN II-XII intact, normal sensory observed, normal motor observed, normal speech observed.    Psychiatric:  Cooperative, appropriate mood & affect.       Medical Decision Making   Rationale:  PA/NP reviewed with co-signing physician: diagnosis and plan of care.   Documents reviewed:  Emergency department nurses' notes, flowsheet, emergency department records, prior records, vital signs.    Radiology results:  Rad Results (ST)   CT Head or Brain w/o Contrast  ?  06/14/18 15:22:12  CT head without: 06/14/18    INDICATION:Headache, focal deficit or papilledema;[facility].    COMPARISON: MRI brain January 20, 2018    TECHNIQUE: Noncontrast axial imaging from foramen magnum to the vertex. CT  scanning was performed using radiation dose reduction techniques when  appropriate, per system protocols.    FINDINGS:  Parenchyma: No infarction. No hemorrhage. No mass or mass effect.    Extra-axial Collection: None    Ventricular System: Normal    Dural Venous Sinuses: Normal    Osseous Structures: Normal    Included Orbits: Normal    Paranasal Sinuses: Predominantly clear    Tympanomastoid Cavities: Normal    Other: None    IMPRESSION:  No acute intracranial abnormality.  ?  Signed By: Gardiner Fanti      Reexamination/ Reevaluation   Notes: This is a 23 year old female with a history of migraines presenting today after seeing an optometrist with reported "swelling of the optic nerve" bilaterally and hypertension.  She sounds like she had a diagnosis of papilledema and apparently had a elevated blood pressure at the optometrist.  Her blood pressure on arrival today was 140/78.  She reports a history of migraines, followed by Dr. Kizzie Bane for this but also reported decreased peripheral vision, blurry vision as well as loss of vision with changes in position over  time.  She reports lightheadedness and dizziness as well.  Today she had a CT of the head without contrast which was unremarkable.  Based on her symptomatology there were concerns for pseudotumor cerebri.  I consulted Dr. Kizzie Bane.  Call was placed to Dr.  Hughes at [place] however we did not receive a call back and voicemail was full.  She was also referred to Dr. Lanora Manis for neuro-ophthalmology.  We did contact their office and was able to get her in for an appointment the next day, 06/15/2018 at 10 AM.  Patient and mother agree with this plan and she is stable for discharge..      Impression and Plan   Diagnosis   Papilledema, both eyes (ICD10-CM H47.10, Discharge, Medical)   Headache (ICD10-CM R51, Discharge, Medical)   Plan   Condition: Stable.    Disposition: Discharged: Time  06/14/2018 16:24:00, to home.    Follow up with: Jonny Ruiz KERRISON-MD Jun/02/2018 10:00 AM Return to ED if symptoms worsen including worsening with visual changes, headaches, dizziness, vomiting or other concerns.  Your appointment is with Dr. Laural Benes;.    Counseled: I had a detailed discussion with the patient and/or guardian regarding the historical points/exam findings supporting the discharge diagnosis and need for outpatient followup. Discussed the need to return to the ER if symptoms persist/worsen, or for any questions/concerns that arise at home.    Games developer Signed on 06/14/2018 04:47 PM EDT   ________________________________________________   Carrie Shields,  Carrie Shields               Modified by: Carrie Shields on 06/14/2018 03:38 PM EDT      Modified by: Carrie Shields,  Carrie Shields on 06/14/2018 04:30 PM EDTAddendum by Carrie Shields on June 14, 2018 21:30 EDT               The patient???s history, exam findings, diagnostics, and a summary of any interventions or procedures was reviewed in detail with PA.  Signature Line     Electronically Signed on 06/14/2018 09:30 PM EDT    ________________________________________________   Carrie Shields               Modified by: Carrie Shields on 06/14/2018 09:30 PM EDT

## 2018-06-14 NOTE — ED Notes (Signed)
ED Patient Education Note     Patient Education Materials Follows:

## 2018-06-14 NOTE — ED Notes (Signed)
ED Triage Note       ED Triage Adult Entered On:  06/14/2018 14:35 EDT    Performed On:  06/14/2018 14:31 EDT by [name], RN, [name]               Triage   Chief Complaint :   pt reports while at optomitrist she was noted to have inflammed optical nerves. reports HA with dizziness during position changes and intermittent blurred vision. denies hx HTN   Numeric Rating Pain Scale :   7   [place] Mode of Arrival :   Walking   Infectious Disease Documentation :   Document assessment   Patient received chemo or biotherapy last 48 hrs? :   No   Temperature Oral :   37.5 degC(Converted to: 99.5 degF)  (HI)    Heart Rate Monitored :   78 bpm   Respiratory Rate :   15 br/min   Systolic Blood Pressure :   140 mmHg   Diastolic Blood Pressure :   78 mmHg   SpO2 :   98 %   Oxygen Therapy :   Room air   Patient presentation :   None of the above   Chief Complaint or Presentation suggest infection :   No   Dosing Weight Obtained By :   Measured   Weight Dosing :   131.6 kg(Converted to: 290 lb 2 oz)    Height :   182 cm(Converted to: 6 ft 0 in)    Body Mass Index Dosing :   40 kg/m2   Dot LanesBazzle, RN, [name] - 06/14/2018 14:31 EDT   DCP GENERIC CODE   Tracking Acuity :   3   Tracking Group :   ED [employer]   [place] GroveBazzle, RN, Vista LawmanKrystle L - 06/14/2018 14:31 EDT   ED General Section :   Document assessment   Pregnancy Status :   Patient denies   ED Allergies Section :   Document assessment   ED Reason for Visit Section :   Document assessment   ED Home Meds Section :   Document assessment   ED Quick Assessment :   Patient appears awake, alert, oriented to baseline. Skin warm and dry. Moves all extremities. Respiration even and unlabored. Appears in no apparent distress.   Vann CrossroadsBazzle, RN, Vista LawmanKrystle L - 06/14/2018 14:31 EDT   ID Risk Screen Symptoms   Recent Travel History :   No recent travel   Close Contact with COVID-19  ID :   No   Have you been tested for COVID-19 ID :   No   TB Symptom Screen :   No symptoms   C. diff Symptom/History ID  :   Neither of the above   [place]outh[place]azzle, RN, [place] Dam LakeKrystle L - 06/14/2018 14:31 EDT   Allergies   (As Of: 06/14/2018 14:35:12 EDT)   Allergies (Active)   Augmentin  Estimated Onset Date:   Unspecified ; Reactions:   Hives / Itching ; Created By:   [name], RN, [name]; Reaction Status:   Active ; Category:   Drug ; Substance:   Augmentin ; Type:   Allergy ; Severity:   Unknown ; Updated By:   [name], RN, Charm BargesSUSAN L; Reviewed Date:   10/13/2017 10:04 EDT      ertapenem  Estimated Onset Date:   Unspecified ; Reactions:   Hives / Itching ; Created By:   [name], RN, [name]; Reaction Status:   Active ; Category:  Drug ; Substance:   ertapenem ; Type:   Allergy ; Severity:   Unknown ; Updated By:   Lady Gary RN, Charm Barges; Reviewed Date:   10/13/2017 10:04 EDT      penicillin  Estimated Onset Date:   Unspecified ; Reactions:   Rash ; Created By:   Jorge Mandril, RN, [name]; Reaction Status:   Active ; Category:   Drug ; Substance:   penicillin ; Type:   Allergy ; Severity:   Moderate ; Updated By:   Jorge Mandril, RN, Vista Lawman; Reviewed Date:   06/14/2018 14:32 EDT        Psycho-Social   Last 3 mo, thoughts killing self/others :   Patient denies   Dot Lanes - 06/14/2018 14:31 EDT   ED Home Med List   Medication List   (As Of: 06/14/2018 14:35:12 EDT)   Home Meds    acetaminophen/aspirin/caffeine  :   acetaminophen/aspirin/caffeine ; Status:   Documented ; Ordered As Mnemonic:   Excedrin Migraine ; Simple Display Line:   Oral, q6hr, 0 Refill(s) ; Catalog Code:   acetaminophen/aspirin/caffeine ; Order Dt/Tm:   06/14/2018 14:35:04 EDT ; Comment:   MAX DAILY DOSE OF ACETAMINOPHEN = 4000 MG          escitalopram  :   escitalopram ; Status:   Documented ; Ordered As Mnemonic:   Lexapro 20 mg oral tablet ; Simple Display Line:   20 mg, 1 tabs, Oral, Daily, 30 tabs, 0 Refill(s) ; Catalog Code:   escitalopram ; Order Dt/Tm:   06/14/2018 14:34:54 EDT          ibuprofen  :   ibuprofen ; Status:   Completed ; Ordered As Mnemonic:   ibuprofen ; Simple Display Line:    800 mg, Oral, TID, PRN: mild pain (1-3), 0 Refill(s) ; Catalog Code:   ibuprofen ; Order Dt/Tm:   10/09/2017 09:24:40 EDT          Misc Rx Supply  :   Misc Rx Supply ; Status:   Completed ; Ordered As Mnemonic:   Antibiotic ointment ; Simple Display Line:   1 application, Topical, Daily, right foot daily, 0 Refill(s) ; Catalog Code:   Misc Rx Supply ; Order Dt/Tm:   10/09/2017 09:24:52 EDT          norgestimate-ethinyl estradiol  :   norgestimate-ethinyl estradiol ; Status:   Documented ; Ordered As Mnemonic:   Sprintec 0.25 mg-35 mcg oral tablet ; Simple Display Line:   1 tabs, Oral, Daily, 28 tabs, 0 Refill(s) ; Catalog Code:   norgestimate-ethinyl estradiol ; Order Dt/Tm:   10/09/2017 09:01:16 EDT          cetirizine  :   cetirizine ; Status:   Documented ; Ordered As Mnemonic:   ZyrTEC 10 mg oral tablet ; Simple Display Line:   10 mg, 1 tabs, Oral, Daily, PRN: as needed for allergy symptoms, 90 tabs, 0 Refill(s) ; Catalog Code:   cetirizine ; Order Dt/Tm:   03/03/2015 11:01:07 EST            ED Reason for Visit   (As Of: 06/14/2018 14:35:12 EDT)   Problems(Active)    Anxiety and depression (SNOMED CT  :16109604 )  Name of Problem:   Anxiety and depression ; Recorder:   Lady Gary, RN, [name]; Confirmation:   Confirmed ; Classification:   Patient Stated ; Code:   [id number] ; Contributor System:   PowerChart ; Last Updated:  10/09/2017 9:08 EDT ; Life Cycle Date:   10/09/2017 ; Life Cycle Status:   Active ; Vocabulary:   SNOMED CT        Delayed wound healing (SNOMED CT  :161096045 )  Name of Problem:   Delayed wound healing ; Recorder:   CANNON, RN, [name]; Confirmation:   Confirmed ; Classification:   Patient Stated ; Code:   [id number] ; Contributor System:   Dietitian ; Last Updated:   10/09/2017 9:31 EDT ; Life Cycle Status:   Active ; Vocabulary:   SNOMED CT   ; Comments:        10/09/2017 9:31 - Lady Gary, RN, [name]  s/p right achilles tendon repair (small area per PA note)      Infection with drug resistant  microorganisms with multiple drug resistance (SNOMED CT  :[phone] )  Name of Problem:   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 ; Recorder:   SYSTEM,  SYSTEM; Confirmation:   Confirmed ; Classification:   Medical ; Code:   [id number] ; Last Updated:   02/14/2015 9:44 EST ; Life Cycle Date:   02/14/2015 ; Life Cycle Status:   Active ; Vocabulary:   SNOMED CT        Migraine (SNOMED CT  :88416606 )  Name of Problem:   Migraine ; Recorder:   Jorge Mandril, RN, Vista Lawman; Confirmation:   Confirmed ; Classification:   Patient Stated ; Code:   30160109 ; Contributor System:   PowerChart ; Last Updated:   06/14/2018 14:33 EDT ; Life Cycle Date:   06/14/2018 ; Life Cycle Status:   Active ; Vocabulary:   SNOMED CT        Seasonal allergies (SNOMED CT  :8601887242 )  Name of Problem:   Seasonal allergies ; Recorder:   MCBRIDE, RN, ERIN R; Confirmation:   Confirmed ; Classification:   Patient Stated ; Code:   [phone] ; Contributor System:   PowerChart ; Last Updated:   01/19/2015 9:39 EST ; Life Cycle Date:   01/19/2015 ; Life Cycle Status:   Active ; Vocabulary:   SNOMED CT          Diagnoses(Active)    Dizziness  Date:   06/14/2018 ; Diagnosis Type:   Reason For Visit ; Confirmation:   Complaint of ; Clinical Dx:   Dizziness ; Classification:   Medical ; Clinical Service:   Non-Specified ; Code:   PNED ; Probability:   0 ; Diagnosis Code:   [id number]      Headache  Date:   06/14/2018 ; Diagnosis Type:   Reason For Visit ; Confirmation:   Complaint of ; Clinical Dx:   Headache ; Classification:   Medical ; Clinical Service:   Non-Specified ; Code:   PNED ; Probability:   0 ; Diagnosis Code:   [id number]      HTN - Hypertension  Date:   06/14/2018 ; Diagnosis Type:   Reason For Visit ; Confirmation:   Complaint of ; Clinical Dx:   HTN - Hypertension ;  Classification:   Medical ; Clinical Service:   Non-Specified ; Code:   PNED ; Probability:   0 ; Diagnosis Code:   [phone]

## 2018-06-14 NOTE — ED Notes (Signed)
ED Triage Note       ED Secondary Triage Entered On:  06/14/2018 15:06 EDT    Performed On:  06/14/2018 15:05 EDT by Knower, RN, Darcus Austin               General Information   Barriers to Learning :   None evident   ED Home Meds Section :   Document assessment   [facility] ED Fall Risk Section :   Document assessment   ED Advance Directives Section :   Document assessment   Knower, RN, Darcus Austin - 06/14/2018 15:05 EDT   (As Of: 06/14/2018 15:06:01 EDT)   Problems(Active)    Anxiety and depression (SNOMED CT  :56389373 )  Name of Problem:   Anxiety and depression ; Recorder:   Lady Gary, RN, [name]; Confirmation:   Confirmed ; Classification:   Patient Stated ; Code:   [id number] ; Contributor System:   PowerChart ; Last Updated:   10/09/2017 9:08 EDT ; Life Cycle Date:   10/09/2017 ; Life Cycle Status:   Active ; Vocabulary:   SNOMED CT        Delayed wound healing (SNOMED CT  :572620355 )  Name of Problem:   Delayed wound healing ; Recorder:   CANNON, RN, [name]; Confirmation:   Confirmed ; Classification:   Patient Stated ; Code:   [id number] ; Contributor System:   Dietitian ; Last Updated:   10/09/2017 9:31 EDT ; Life Cycle Status:   Active ; Vocabulary:   SNOMED CT   ; Comments:        10/09/2017 9:31 - Lady Gary, RN, [name]  s/p right achilles tendon repair (small area per PA note)      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  :6103236167 )  Name of Problem:   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 ; Recorder:   SYSTEM,  SYSTEM; Confirmation:   Confirmed ; Classification:   Medical ; Code:   [id number] ; Last Updated:   02/14/2015 9:44 EST ; Life Cycle Date:   02/14/2015 ; Life Cycle Status:   Active ; Vocabulary:   SNOMED CT        Migraine (SNOMED CT  :27078675 )  Name of Problem:   Migraine ; Recorder:   Jorge Mandril, RN, Vista Lawman; Confirmation:   Confirmed ; Classification:   Patient Stated ; Code:    [id number] ; Contributor System:   PowerChart ; Last Updated:   06/14/2018 14:33 EDT ; Life Cycle Date:   06/14/2018 ; Life Cycle Status:   Active ; Vocabulary:   SNOMED CT        Seasonal allergies (SNOMED CT  :[phone] )  Name of Problem:   Seasonal allergies ; Recorder:   MCBRIDE, RN, ERIN R; Confirmation:   Confirmed ; Classification:   Patient Stated ; Code:   [phone] ; Contributor System:   PowerChart ; Last Updated:   01/19/2015 9:39 EST ; Life Cycle Date:   01/19/2015 ; Life Cycle Status:   Active ; Vocabulary:   SNOMED CT          Diagnoses(Active)    Dizziness  Date:   06/14/2018 ; Diagnosis Type:   Reason For Visit ; Confirmation:   Complaint of ; Clinical Dx:   Dizziness ; Classification:   Medical ; Clinical Service:   Non-Specified ; Code:   PNED ; Probability:   0 ; Diagnosis Code:   [id number]  Headache  Date:   06/14/2018 ; Diagnosis Type:   Reason For Visit ; Confirmation:   Confirmed ; Clinical Dx:   Headache ; Classification:   Medical ; Clinical Service:   Emergency medicine ; Code:   PNED ; Probability:   0 ; Diagnosis Code:   [id number]      Headache  Date:   06/14/2018 ; Diagnosis Type:   Reason For Visit ; Confirmation:   Complaint of ; Clinical Dx:   Headache ; Classification:   Medical ; Clinical Service:   Non-Specified ; Code:   PNED ; Probability:   0 ; Diagnosis Code:   [id number]      HTN - Hypertension  Date:   06/14/2018 ; Diagnosis Type:   Reason For Visit ; Confirmation:   Complaint of ; Clinical Dx:   HTN - Hypertension ; Classification:   Medical ; Clinical Service:   Non-Specified ; Code:   PNED ; Probability:   0 ; Diagnosis Code:   [id number]             -    Procedure History   (As Of: 06/14/2018 15:06:01 EDT)     Procedure Dt/Tm:   01/22/2014 ; Anesthesia Minutes:   0 ; Procedure Name:   Extraction of wisdom tooth ; Procedure  Minutes:   0            Procedure Dt/Tm:   01/13/2013 ; Anesthesia Minutes:   0 ; Procedure Name:   Arthroscopy ; Procedure Minutes:   0 ; Comments:     01/19/2015 9:40 EST - MCBRIDE, RN, ERIN R  RIGHT KNEE            Procedure Dt/Tm:   01/19/2015 13:19:00 EST ; Location:   [facility] OR ; Provider:   LAMB-MD,  JOSHUA H; Anesthesia Type:   General ; :   FERLA-MD,  BRIAN P; Anesthesia Minutes:   0 ; Procedure Name:   Ankle Achilles Tendon Repair (Ankle, Left) ; Procedure Minutes:   79 ; Comments:     01/19/2015 14:45 EST - ZEIF, RN, REBECCA N  auto-populated from documented surgical case ; Clinical Service:   Surgery            Procedure Dt/Tm:   02/12/2015 16:52:00 EST ; Location:   [facility] OR ; Provider:   LAMB-MD,  JOSHUA H; Anesthesia Type:   General ; :   FERLA-MD,  BRIAN P; Anesthesia Minutes:   0 ; Procedure Name:   Ankle Achilles Tendon Repair (Left) ; Procedure Minutes:   1 ; Comments:     02/12/2015 18:26 EST - Anner Crete, RN, RON D  auto-populated from documented surgical case ; Clinical Service:   Surgery            Procedure Dt/Tm:   02/12/2015 16:52:00 EST ; Location:   [facility] OR ; Provider:   LAMB-MD,  JOSHUA H; Anesthesia Type:   General ; :   FERLA-MD,  BRIAN P; Anesthesia Minutes:   0 ; Procedure Name:   Ankle and/or Foot Incision and Drainage (Left) ; Procedure Minutes:   72 ; Comments:     02/12/2015 18:26 EST - Anner Crete, RN, RON D  auto-populated from documented surgical case ; Clinical Service:   Surgery            Procedure Dt/Tm:   03/04/2015 13:36:00 EST ; Location:   [facility] OR ; Provider:   LAMB-MD,  JOSHUA H; Anesthesia Type:   General ; :  CRAMER-MD,  Sol PasserWILLIAM E; Anesthesia Minutes:   0 ; Procedure Name:   Ankle and/or Foot Incision and Drainage ; Procedure Minutes:   1502 ; Comments:     03/04/2015 15:04 EST - DANIEL, RN, MELISSA S  auto-populated from documented surgical case ; Clinical Service:   Surgery            Procedure Dt/Tm:   07/26/2017 15:26:00 EDT ; Location:   [facility] ; Provider:   Marcine MatarHLSON-MD,  BLAKE; Anesthesia Type:    General ; :   PENDARVIS-MD, JR, ALLEN E; Anesthesia Minutes:   0 ; Procedure Name:   Ankle Achilles Tendon Repair (Right) ; Procedure Minutes:   31 ; Comments:     07/26/2017 16:05 EDT - Stacie AcresFelice, RN, Elizabeth  auto-populated from documented surgical case ; Clinical Service:   Surgery            Procedure Dt/Tm:   10/13/2017 12:32:00 EDT ; Location:   [facility] OR ; Provider:   Marcine MatarHLSON-MD,  BLAKE; Anesthesia Type:   General ; :   CHOI-MD,  YOUNG; Anesthesia Minutes:   0 ; Procedure Name:   Ankle and/or Foot Incision and Drainage (Right) ; Procedure Minutes:   20 ; Comments:     10/13/2017 13:00 EDT - ABELS, RN, PENNE L  auto-populated from documented surgical case ; Clinical Service:   Surgery            [facility] Fall Risk Assessment Tool   Hx of falling last 3 months ED Fall :   No   Patient confused or disoriented ED Fall :   No   Patient intoxicated or sedated ED Fall :   No   Patient impaired gait ED Fall :   No   Use a mobility assistance device ED Fall :   No   Patient altered elimination ED Fall :   No   [facility] ED Fall Score :   0    Knower, RN, Darcus Austinmily R - 06/14/2018 15:05 EDT   ED Advance Directive   Advance Directive :   No   Knower, RN, Darcus AustinEmily R - 06/14/2018 15:05 EDT

## 2018-06-14 NOTE — ED Notes (Signed)
ED Pre-Arrival Note        Pre-Arrival Summary    Name:  optometrist,    Current Date:  06/14/2018 14:27:38 EDT  Gender:  Female  Date of Birth:  1995-08-06  Age:  23 years  Pre-Arrival Type:  EMS  ETA:  06/14/2018 14:50:00 EDT  Primary Care Physician:    Presenting Problem:  HIGH BP  Pre-Arrival User:  Donivan Scull RN, Anderson Malta  Referring Source:    Full Name:  Joya Martyr  Location:  [facility]            PreArrival Communication Form  Emergency Department        Additional Patient Information:        Orders:  [    ] CBC                                            [     ] CT Head no contrast  [    ] BMP                                           [     ] CT Abdomen/Pelvis no contrast  [    ] PT/INR                                       [     ] CT Abdomen/Pelvis IV contrast, w/ oral contrast  [    ] Troponin                                   [     ] CT Abdomen/Pelvis IV contrast, no oral contrast  [    ] BNP                                            [     ] See ordersheet  [    ] CXR                                             [     ] Other:__________________________  [    ] EKG

## 2018-06-14 NOTE — Discharge Summary (Signed)
ED Clinical Summary                         [facility]  [address]  [place], [place] [address]  [phone]           PERSON INFORMATION  Name: Carrie Shields, Carrie Shields Age:  23 Years DOB: Feb 10, 1995   Sex: Female Language: English PCP: Loanne Drilling   Marital Status:  Single Phone: [phone] Med Service: MED-Medicine   MRN:  [id number] Acct# [id number] Arrival: 06/14/2018 14:26:00   Visit Reason: Headache; Dizziness; Headache; HTN - Hypertension; HIGH BP Acuity: 3 LOS: 000 02:25   Address:      [address] [place] [address]  Diagnosis:      Headache; Papilledema, both eyes  Printed Prescriptions:            Allergies      ertapenem (Hives / Itching)      Augmentin (Hives / Itching)      penicillin (Rash)      Medications Administered During Visit:                  Medication Dose Route   acetaminophen 1000 mg Oral       Patient Medication List:              acetaminophen/aspirin/caffeine (Excedrin Migraine) Oral (given by mouth) every 6 hours., MAX DAILY DOSE OF ACETAMINOPHEN = 4000 MG  cetirizine (ZyrTEC 10 mg oral tablet) 1 Tabs Oral (given by mouth) every day as needed as needed for allergy symptoms.  escitalopram (Lexapro 20 mg oral tablet) 1 Tabs Oral (given by mouth) every day.  norgestimate-ethinyl estradiol (Sprintec 0.25 mg-35 mcg oral tablet) 1 Tabs Oral (given by mouth) every day.         Major Tests and Procedures:  The following procedures and tests were performed during your ED visit.  COMMONPROCEDURES%>  COMMON PROCEDURESCOMMENTS%>          Laboratory Orders  No laboratory orders were placed.              Radiology Orders  Name Status Details   CT Head or Brain w/o Contrast Completed 06/14/18 14:49:00 EDT, STAT 1 hour or less, Reason: ACR Select, Reason: Headache, focal deficit or papilledema, Transport Mode: STRETCHER, Rad Type, pp_set_radiology_subspecialty, [id number], 7               Patient Care Orders  Name Status Details    Discharge Patient Ordered 06/14/18 16:30:00 EDT   ED Assessment Adult Completed 06/14/18 14:35:13 EDT, 06/14/18 14:35:13 EDT   ED Secondary Triage Completed 06/14/18 14:35:13 EDT, 06/14/18 14:35:13 EDT   ED Triage Adult Completed 06/14/18 14:26:50 EDT, 06/14/18 14:26:50 EDT             PROVIDER INFORMATION               Provider Role Assigned Sherral Hammers, RN, Darcus Austin ED Nurse 06/14/2018 14:32:12    HEFFNER-PA, Collier Bullock ED MidLevel 06/14/2018 14:32:41        Attending Physician:  Crist Fat K     Admit Doc  HEFFNER-PA,  LINDSEY K     Consulting Doc  HUGHES-MD,  Ardell Isaacs,  JOHN     VITALS INFORMATION  Vital Sign Triage Latest   Temp Oral ORAL_1%>37.5 degC ORAL%>37.5 degC   Temp Temporal TEMPORAL_1%> TEMPORAL%>   Temp Intravascular INTRAVASCULAR_1%> INTRAVASCULAR%>  Temp Axillary AXILLARY_1%> AXILLARY%>   Temp Rectal RECTAL_1%> RECTAL%>   02 Sat 98 % 98 %   Respiratory Rate RATE_1%>15 br/min RATE%>16 br/min   Peripheral Pulse Rate PULSE RATE_1%> PULSE RATE%>   Apical Heart Rate HEART RATE_1%> HEART RATE%>   Blood Pressure BLOOD PRESSURE_1%>/ BLOOD PRESSURE_1%>78 mmHg BLOOD PRESSURE%>125 mmHg / BLOOD PRESSURE%>76 mmHg                 Immunizations      No Immunizations Documented This Visit          DISCHARGE INFORMATION   Discharge Disposition: H Outpt-Sent Home   Discharge Location:    Home   Discharge Date and Time:    06/14/2018 16:51:54   ED Checkout Date and Time:    06/14/2018 16:51:54     DEPART REASON INCOMPLETE INFORMATION               Depart Action Incomplete Reason   Interactive View/I&O Recently assessed   Patient Education Recently assessed               Problems      Active           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 [place], [place]  [address]  [phone] Business (1) Jun/02/2018 10:00 AM   Comments:   Return to ED if symptoms worsen including worsening with visual changes, headaches, dizziness, vomiting or other concerns.   Your appointment is with Dr. Laural Benes           ED PROVIDER DOCUMENTATION     Patient:   Carrie Shields, Carrie Shields            MRN: [id number]            FIN: [id number]               Age:   23 years     Sex:  Female     DOB:  05/03/1995   Associated Diagnoses:   Papilledema, both eyes; Headache   Author:   Oren Section      Basic Information   Time seen: Provider Seen (ST)   ED Provider/Time:    HEFFNER-PA,  LINDSEY K / 06/14/2018 14:32  .   Additional information: Chief Complaint from Nursing Triage Note   Chief Complaint  Chief Complaint: pt reports while at optomitrist she was noted to have inflammed optical nerves. reports HA with dizziness during position changes and intermittent blurred vision. denies hx HTN (06/14/18 14:31:00).      History of Present Illness   This is a pleasant 23 year old female presenting today from the optometrist office with a "inflamed optic nerve" bilaterally.  Patient is with her mother today.  They report that she went to see the optometrist that she has had worsening blurry vision.  She does have glasses but states she does not typically wear them.  She is also had intermittent headaches with dizziness, mostly with positional changes and sometimes states loss of vision when bending  Temp Axillary AXILLARY_1%> AXILLARY%>   Temp Rectal RECTAL_1%> RECTAL%>   02 Sat 98 % 98 %   Respiratory Rate RATE_1%>15 br/min RATE%>16 br/min   Peripheral Pulse Rate PULSE RATE_1%> PULSE RATE%>   Apical Heart Rate HEART RATE_1%> HEART RATE%>   Blood Pressure BLOOD PRESSURE_1%>/ BLOOD PRESSURE_1%>78 mmHg BLOOD PRESSURE%>125 mmHg / BLOOD PRESSURE%>76 mmHg                 Immunizations      No Immunizations Documented This Visit          DISCHARGE INFORMATION   Discharge Disposition: H Outpt-Sent Home   Discharge Location:    Home   Discharge Date and Time:    06/14/2018 16:51:54   ED Checkout Date and Time:    06/14/2018 16:51:54     DEPART REASON INCOMPLETE INFORMATION               Depart Action Incomplete Reason   Interactive View/I&O Recently assessed   Patient Education Recently assessed               Problems      Active           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 [place], [place]  [address]  [phone] Business (1) Jun/02/2018 10:00 AM   Comments:   Return to ED if symptoms worsen including worsening with visual changes, headaches, dizziness, vomiting or other concerns.   Your appointment is with Dr. Laural Benes           ED PROVIDER DOCUMENTATION     Patient:   Carrie Shields, Carrie Shields            MRN: [id number]            FIN: [id number]               Age:   23 years     Sex:  Female     DOB:  05/03/1995   Associated Diagnoses:   Papilledema, both eyes; Headache   Author:   Oren Section      Basic Information   Time seen: Provider Seen (ST)   ED Provider/Time:    HEFFNER-PA,  LINDSEY K / 06/14/2018 14:32  .   Additional information: Chief Complaint from Nursing Triage Note   Chief Complaint  Chief Complaint: pt reports while at optomitrist she was noted to have inflammed optical nerves. reports HA with dizziness during position changes and intermittent blurred vision. denies hx HTN (06/14/18 14:31:00).      History of Present Illness   This is a pleasant 23 year old female presenting today from the optometrist office with a "inflamed optic nerve" bilaterally.  Patient is with her mother today.  They report that she went to see the optometrist that she has had worsening blurry vision.  She does have glasses but states she does not typically wear them.  She is also had intermittent headaches with dizziness, mostly with positional changes and sometimes states loss of vision when bending  ED Clinical Summary                         [facility]  [address]  [place], [place] [address]  [phone]           PERSON INFORMATION  Name: Carrie Shields, Carrie Shields Age:  23 Years DOB: Feb 10, 1995   Sex: Female Language: English PCP: Loanne Drilling   Marital Status:  Single Phone: [phone] Med Service: MED-Medicine   MRN:  [id number] Acct# [id number] Arrival: 06/14/2018 14:26:00   Visit Reason: Headache; Dizziness; Headache; HTN - Hypertension; HIGH BP Acuity: 3 LOS: 000 02:25   Address:      [address] [place] [address]  Diagnosis:      Headache; Papilledema, both eyes  Printed Prescriptions:            Allergies      ertapenem (Hives / Itching)      Augmentin (Hives / Itching)      penicillin (Rash)      Medications Administered During Visit:                  Medication Dose Route   acetaminophen 1000 mg Oral       Patient Medication List:              acetaminophen/aspirin/caffeine (Excedrin Migraine) Oral (given by mouth) every 6 hours., MAX DAILY DOSE OF ACETAMINOPHEN = 4000 MG  cetirizine (ZyrTEC 10 mg oral tablet) 1 Tabs Oral (given by mouth) every day as needed as needed for allergy symptoms.  escitalopram (Lexapro 20 mg oral tablet) 1 Tabs Oral (given by mouth) every day.  norgestimate-ethinyl estradiol (Sprintec 0.25 mg-35 mcg oral tablet) 1 Tabs Oral (given by mouth) every day.         Major Tests and Procedures:  The following procedures and tests were performed during your ED visit.  COMMONPROCEDURES%>  COMMON PROCEDURESCOMMENTS%>          Laboratory Orders  No laboratory orders were placed.              Radiology Orders  Name Status Details   CT Head or Brain w/o Contrast Completed 06/14/18 14:49:00 EDT, STAT 1 hour or less, Reason: ACR Select, Reason: Headache, focal deficit or papilledema, Transport Mode: STRETCHER, Rad Type, pp_set_radiology_subspecialty, [id number], 7               Patient Care Orders  Name Status Details    Discharge Patient Ordered 06/14/18 16:30:00 EDT   ED Assessment Adult Completed 06/14/18 14:35:13 EDT, 06/14/18 14:35:13 EDT   ED Secondary Triage Completed 06/14/18 14:35:13 EDT, 06/14/18 14:35:13 EDT   ED Triage Adult Completed 06/14/18 14:26:50 EDT, 06/14/18 14:26:50 EDT             PROVIDER INFORMATION               Provider Role Assigned Sherral Hammers, RN, Darcus Austin ED Nurse 06/14/2018 14:32:12    HEFFNER-PA, Collier Bullock ED MidLevel 06/14/2018 14:32:41        Attending Physician:  Crist Fat K     Admit Doc  HEFFNER-PA,  LINDSEY K     Consulting Doc  HUGHES-MD,  Ardell Isaacs,  JOHN     VITALS INFORMATION  Vital Sign Triage Latest   Temp Oral ORAL_1%>37.5 degC ORAL%>37.5 degC   Temp Temporal TEMPORAL_1%> TEMPORAL%>   Temp Intravascular INTRAVASCULAR_1%> INTRAVASCULAR%>

## 2018-06-29 NOTE — Nursing Note (Signed)
Adult Admission Assessment - Text       Perioperative Admission Assessment Entered On:  06/29/2018 15:25 EDT    Performed On:  06/29/2018 15:11 EDT by [name], RN, [name]               General   Call Start :   06/29/2018 15:15 EDT   Call Complete :   06/29/2018 15:25 EDT   Information Given By :   Self   Height/Length Estimated :   182.88 cm(Converted to: 6 ft 0 in, 6.00 ft, 72.00 in)    BMI   Estimated :   115.6 kg(Converted to: 254 lb 14 oz, 254.854 lb)    Body Mass Index Estimated :   34.56 kg/m2   Primary Care Physician/Specialists :   DR. Cyril MourningJOHN SUTTERLIN, PCP  DR. Teal'c.PitcherTHOMAS HUGHES   Emergency Contact Name :   Carrie FlakesKATHLEEN Shields- MOTHER   Emergency Contact Phone :   [phone], CELL   Languages :   English   Preferred Communication Mode :   Verbal   LUTZ, RN, [name] - 06/29/2018 15:11 EDT   Allergies   (As Of: 07/02/2018 08:48:18 EDT)   Allergies (Active)   Augmentin  Estimated Onset Date:   Unspecified ; Reactions:   Hives / Itching ; Created By:   [name], RN, [name]; Reaction Status:   Active ; Category:   Drug ; Substance:   Augmentin ; Type:   Allergy ; Severity:   Unknown ; Updated By:   [name], RN, Charm BargesSUSAN L; Reviewed Date:   07/02/2018 8:44 EDT      ertapenem  Estimated Onset Date:   Unspecified ; Reactions:   Hives / Itching ; Created By:   [name], RN, [name]; Reaction Status:   Active ; Category:   Drug ; Substance:   ertapenem ; Type:   Allergy ; Severity:   Unknown ; Updated By:   [name], RN, Charm BargesSUSAN L; Reviewed Date:   07/02/2018 8:44 EDT      penicillin  Estimated Onset Date:   Unspecified ; Reactions:   Rash ; Created By:   [name], RN, [name]; Reaction Status:   Active ; Category:   Drug ; Substance:   penicillin ; Type:   Allergy ; Severity:   Moderate ; Updated By:   [name], RN, Vista LawmanKrystle L; Reviewed Date:   07/02/2018 8:44 EDT        Medication History   Medication List   (As Of: 07/02/2018 08:48:18 EDT)   Home Meds    acetaminophen/aspirin/caffeine  :   acetaminophen/aspirin/caffeine ; Status:   Documented ;  Ordered As Mnemonic:   Excedrin Migraine ; Simple Display Line:   1 tabs, Oral, q6hr, PRN: headache, 0 Refill(s) ; Catalog Code:   acetaminophen/aspirin/caffeine ; Order Dt/Tm:   06/14/2018 14:35:04 EDT ; Comment:   MAX DAILY DOSE OF ACETAMINOPHEN = 4000 MG          norgestimate-ethinyl estradiol  :   norgestimate-ethinyl estradiol ; Status:   Documented ; Ordered As Mnemonic:   Sprintec 0.25 mg-35 mcg oral tablet ; Simple Display Line:   1 tabs, Oral, qAM, 28 tabs, 0 Refill(s) ; Catalog Code:   norgestimate-ethinyl estradiol ; Order Dt/Tm:   10/09/2017 09:01:16 EDT          cetirizine  :   cetirizine ; Status:   Documented ; Ordered As Mnemonic:   ZyrTEC 10 mg oral tablet ; Simple Display Line:   10 mg,  1 tabs, Oral, Daily, PRN: as needed for allergy symptoms, 90 tabs, 0 Refill(s) ; Catalog Code:   cetirizine ; Order Dt/Tm:   03/03/2015 11:01:07 EST          escitalopram  :   escitalopram ; Status:   Documented ; Ordered As Mnemonic:   Lexapro 20 mg oral tablet ; Simple Display Line:   20 mg, 1 tabs, Oral, qAM, 30 tabs, 0 Refill(s) ; Catalog Code:   escitalopram ; Order Dt/Tm:   06/14/2018 14:34:54 EDT          Non-Formulary Medication  :   Non-Formulary Medication ; Status:   Documented ; Ordered As Mnemonic:   UBRELVY ; Simple Display Line:   100 mg, Oral, MIGRAINES, PRN, 0 Refill(s) ; Catalog Code:   Non-Formulary Medication ; Order Dt/Tm:   06/29/2018 15:17:50 EDT            Problem History   (As Of: 07/02/2018 08:48:18 EDT)   Problems(Active)    Anxiety and depression (SNOMED CT  :[id number] )  Name of Problem:   Anxiety and depression ; Recorder:   [name], RN, [name]; Confirmation:   Confirmed ; Classification:   Patient Stated ; Code:   [id number] ; Contributor System:   PowerChart ; Last Updated:   10/09/2017 9:08 EDT ; Life Cycle Date:   10/09/2017 ; Life Cycle Status:   Active ; Vocabulary:   SNOMED CT        Intracranial hypertension (SNOMED CT  :[id number] )  Name of Problem:   Intracranial hypertension ; Recorder:    LUTZ, RN, [name]; Confirmation:   Confirmed ; Classification:   Patient Stated ; Code:   [id number] ; Contributor System:   DietitianowerChart ; Last Updated:   06/29/2018 15:20 EDT ; Life Cycle Date:   06/29/2018 ; Life Cycle Status:   Active ; Vocabulary:   SNOMED CT        Migraine (SNOMED CT  :[id number] )  Name of Problem:   Migraine ; Recorder:   [name], RN, Vista LawmanKrystle L; Confirmation:   Confirmed ; Classification:   Patient Stated ; Code:   [id number] ; Contributor System:   PowerChart ; Last Updated:   06/14/2018 14:33 EDT ; Life Cycle Date:   06/14/2018 ; Life Cycle Status:   Active ; Vocabulary:   SNOMED CT        Seasonal allergies (SNOMED CT  :[id number] )  Name of Problem:   Seasonal allergies ; Recorder:   MCBRIDE, RN, ERIN R; Confirmation:   Confirmed ; Classification:   Patient Stated ; Code:   [phone] ; Contributor System:   PowerChart ; Last Updated:   01/19/2015 9:39 EST ; Life Cycle Date:   01/19/2015 ; Life Cycle Status:   Active ; Vocabulary:   SNOMED CT        Wears glasses (SNOMED CT  :[id number] )  Name of Problem:   Wears glasses ; Recorder:   LUTZ, RN, [name]; Confirmation:   Confirmed ; Classification:   Patient Stated ; Code:   [id number] ; Contributor System:   DietitianowerChart ; Last Updated:   06/29/2018 15:20 EDT ; Life Cycle Date:   06/29/2018 ; Life Cycle Status:   Active ; Vocabulary:   SNOMED CT          Procedure History        -    Procedure History   (As Of: 07/02/2018 08:48:18 EDT)     Procedure Dt/Tm:  01/22/2014 ; Anesthesia Minutes:   0 ; Procedure Name:   Extraction of wisdom tooth ; Procedure Minutes:   0 ; Last Reviewed Dt/Tm:   07/02/2018 08:47:32 EDT            Procedure Dt/Tm:   01/13/2013 ; Anesthesia Minutes:   0 ; Procedure Name:   Arthroscopy ; Procedure Minutes:   0 ; Comments:     01/19/2015 9:40 EST - Lauro Regulus, RN, ERIN R  RIGHT KNEE ; Last Reviewed Dt/Tm:   07/02/2018 08:47:32 EDT            Procedure Dt/Tm:   01/19/2015 13:19:00 EST ; Location:    [facility] ; Provider:   LAMB-MD,  JOSHUA H; Anesthesia Type:   General ; :   FERLA-MD,  BRIAN P; Anesthesia Minutes:   0 ; Procedure Name:   Ankle Achilles Tendon Repair (Ankle, Left) ; Procedure Minutes:   4 ; Comments:     01/19/2015 14:45 EST - ZEIF, RN, REBECCA N  auto-populated from documented surgical case ; Clinical Service:   Surgery ; Last Reviewed Dt/Tm:   07/02/2018 08:47:32 EDT            Procedure Dt/Tm:   02/12/2015 16:52:00 EST ; Location:   [facility] ; Provider:   LAMB-MD,  JOSHUA H; Anesthesia Type:   General ; :   FERLA-MD,  BRIAN P; Anesthesia Minutes:   0 ; Procedure Name:   Ankle Achilles Tendon Repair (Left) ; Procedure Minutes:   66 ; Comments:     02/12/2015 18:26 EST - WELLS, RN, RON D  auto-populated from documented surgical case ; Clinical Service:   Surgery ; Last Reviewed Dt/Tm:   07/02/2018 08:47:32 EDT            Procedure Dt/Tm:   02/12/2015 16:52:00 EST ; Location:   [facility] ; Provider:   LAMB-MD,  JOSHUA H; Anesthesia Type:   General ; :   FERLA-MD,  BRIAN P; Anesthesia Minutes:   0 ; Procedure Name:   Ankle and/or Foot Incision and Drainage (Left) ; Procedure Minutes:   35 ; Comments:     02/12/2015 18:26 EST - WELLS, RN, RON D  auto-populated from documented surgical case ; Clinical Service:   Surgery ; Last Reviewed Dt/Tm:   07/02/2018 08:47:32 EDT            Procedure Dt/Tm:   03/04/2015 13:36:00 EST ; Location:   [facility] ; Provider:   Leighton Ruff; Anesthesia Type:   General ; :   Thomes Lolling; Anesthesia Minutes:   0 ; Procedure Name:   Ankle and/or Foot Incision and Drainage ; Procedure Minutes:   1502 ; Comments:     03/04/2015 15:04 EST - DANIEL, RN, MELISSA S  auto-populated from documented surgical case ; Clinical Service:   Surgery ; Last Reviewed Dt/Tm:   07/02/2018 08:47:32 EDT            Procedure Dt/Tm:   07/26/2017 15:26:00 EDT ; Location:   [facility] ; Provider:   Blair Hailey; Anesthesia Type:   General ; :   PENDARVIS-MD, JR, ALLEN E; Anesthesia Minutes:   0 ; Procedure  Name:   Ankle Achilles Tendon Repair (Right) ; Procedure Minutes:   31 ; Comments:     1/61/0960 45:40 EDT - Loma Boston, RN, Elizabeth  auto-populated from documented surgical case ; Clinical Service:   Surgery ; Last Reviewed Dt/Tm:   07/02/2018 08:47:32 EDT  Procedure Dt/Tm:   10/13/2017 12:32:00 EDT ; Location:   [facility] ; Provider:   Marcine Matar; Anesthesia Type:   General ; :   CHOI-MD,  YOUNG; Anesthesia Minutes:   0 ; Procedure Name:   Ankle and/or Foot Incision and Drainage (Right) ; Procedure Minutes:   20 ; Comments:     10/13/2017 13:00 EDT - ABELS, RN, PENNE L  auto-populated from documented surgical case ; Clinical Service:   Surgery ; Last Reviewed Dt/Tm:   07/02/2018 08:47:32 EDT            History Confirmation   Problem History Changes PAT :   No   Procedure History Changes PAT :   No   STOREN, RN, Cora Collum - 07/02/2018 8:44 EDT   Anesthesia/Sedation   Anesthesia History :   Prior general anesthesia   SN - Malignant Hyperthermia :   Denies   Previous Problem with Anesthesia :   None   Moderate Sedation History :   No prior sedation for procedure   Symptoms of Sleep Apnea :   BMI greater than 35   Symptoms of Sleep Apnea Score :   1    Shortness of Breath Indicator :   No shortness of breath   Pregnancy Status :   Patient denies   Last Menstrual Period :   06/16/2018 EDT   LUTZ, RN, [name] - 06/29/2018 15:11 EDT   Bloodless Medicine   Is Blood Transfusion Acceptable to Patient :   Yes   LUTZ, RN, [name] - 06/29/2018 15:11 EDT   ID Risk Screen Symptoms   Recent Travel History :   No recent travel   Close Contact with COVID-19 ID :   Preadmission testing patients only   TB Symptom Screen :   No symptoms   C. diff Symptom/History ID :   Neither of the above   LUTZ, RN, [name] - 06/29/2018 15:11 EDT   ID COVID-19 Screen   Fever OR Chills :   No   Headache :   No   New or Worsening Cough :   No   Fatigue :   No   Shortness of Breath ID :   No   Myalgia (Muscle Pain) :   No   Dyspnea :   No   Diarrhea :    No   Sore Throat :   No   Nausea :   No   Laryngitis :   No   Sudden Loss of Taste or Smell :   No   LUTZ, RN, Milus Height - 06/29/2018 15:11 EDT   Social History   Social History   (As Of: 07/02/2018 08:48:18 EDT)   Tobacco:        Never smoker   (Last Updated: 02/10/2015 16:37:45 EST by Colon Branch, RN, MELISSA)          Alcohol:        Denies   (Last Updated: 01/19/2015 09:41:36 EST by Adin Hector, RN, ERIN R)          Substance Use:        Denies   (Last Updated: 07/26/2017 12:21:43 EDT by Elijah Birk, RN, TARA J)            Advance Directive   Advance Directive :   Michel Bickers, RN, Milus Height - 06/29/2018 15:11 EDT   PAT Patient Instructions   Additional Contacts PAT :   07/01/18 Covid rescreen-No answer  DENETT, RN, Denice BorsBARBARA L - 07/01/2018 17:43 EDT   Patient Arrival Time PAT :   07/02/2018 8:30 EDT   Medications in AM :   UBRELVY PRN, LEXAPRO, SPRINTEC, ZYRTEC PRN   Medication Understanding :   Verbalizes understanding   NPO PAT :   Other: NPO FOR 2 HOURS PRE PROCEDURE   LUTZ, RN, [name] - 06/29/2018 15:11 EDT   PAT Instructions Grid   Make up Understanding :   Verbalizes understanding   Jewelry Understanding :   Verbalizes understanding   Perfume Understanding :   Verbalizes understanding   Valuables Understanding :   Verbalizes understanding   Clothing Understanding :   Verbalizes understanding   Contact MD for Illness :   Verbalizes understanding   Contact MD for skin injury :   Verbalizes understanding   LUTZ, RN, Milus HeightCARLA W - 06/29/2018 15:11 EDT   Service Line PAT :   IR   Laterality PAT :   N/A   Name of Contact PAT :   Carrie FlakesKATHLEEN Oakley   Relationship of Contact PAT :   MOTHER   Contact Number PAT :   [phone]   Transportation Instructions PAT :   Accompany to Hospital, Transport Home, Remain with 24 hours post-procedure   LUTZ, RN, [name] - 06/29/2018 15:11 EDT   Harm Screen   Injuries/Abuse/Neglect in Household :   Denies   Feels Unsafe at Home :   No   Last 3 mo, thoughts killing self/others :   Patient denies   Walker KehrSTOREN, RN,  JOANNE L - 07/02/2018 8:44 EDT

## 2018-07-02 NOTE — Discharge Summary (Signed)
 Inpatient Clinical Summary             [facility]  Post-Acute Care Transfer Instructions  PERSON INFORMATION   Name: Carrie Shields, Carrie Shields   MRN: [id number]    FIN#: [id number]   PHYSICIANS  Admitting Physician: RAND-MD,  NORLEEN SIMPER  Attending Physician: AUBRY DEBBY RAMAN   PCP: JEANNETTE DOUGLAS NORLEEN VICENTA  Discharge Diagnosis:   Comment:       PATIENT EDUCATION INFORMATION  Instructions:             Lumbar Puncture (LP) Discharge Instructions [facility]) (Custom)  Medication Leaflets:               Follow-up:                                   MEDICATION LIST  Medication Reconciliation at Discharge:          Medications That Have Not Changed  Other Medications  acetaminophen /aspirin/caffeine (Excedrin Migraine) 1 Tabs Oral (given by mouth) every 6 hours as needed headache., MAX DAILY DOSE OF ACETAMINOPHEN  = 4000 MG  Last Dose:____________________  cetirizine (ZyrTEC 10 mg oral tablet) 1 Tabs Oral (given by mouth) every day as needed as needed for allergy symptoms.  Last Dose:____________________  escitalopram (Lexapro 20 mg oral tablet) 1 Tabs Oral (given by mouth) once a day (in the morning).  Last Dose:____________________  Non-Formulary Medication (UBRELVY) 100 Milligram Oral (given by mouth) as needed. MIGRAINES.  Last Dose:____________________  norgestimate-ethinyl estradiol (Sprintec 0.25 mg-35 mcg oral tablet) 1 Tabs Oral (given by mouth) once a day (in the morning).  Last Dose:____________________         Patient's Final Home Medication List Upon Discharge:           acetaminophen /aspirin/caffeine (Excedrin Migraine) 1 Tabs Oral (given by mouth) every 6 hours as needed headache., MAX DAILY DOSE OF ACETAMINOPHEN  = 4000 MG  cetirizine (ZyrTEC 10 mg oral tablet) 1 Tabs Oral (given by mouth) every day as needed as needed for allergy symptoms.  escitalopram (Lexapro 20 mg oral tablet) 1 Tabs Oral (given by mouth) once a day (in the morning).  Non-Formulary Medication (UBRELVY) 100 Milligram Oral (given  by mouth) as needed. MIGRAINES.  norgestimate-ethinyl estradiol (Sprintec 0.25 mg-35 mcg oral tablet) 1 Tabs Oral (given by mouth) once a day (in the morning).         Comment:       ORDERS          Order Name Order Details   Discharge Patient 07/02/18 14:00:00 EDT, Discharge Home/Self Care

## 2018-07-02 NOTE — Op Note (Signed)
Recovery Phase III Record - [facility]             Recovery Phase III Record - [facility] Summary                                                        Primary Physician:        RAND-MDReal Cons    Case Number:              [id number]    Finalized Date/Time:      07/02/18 14:01:51    Pt. Name:                 JILLISA, HARRIS    D.O.B./Sex:               04/25/1995    Female    Med Rec #:                [id number]    Physician:                RAND-MD,  Real Cons    Financial #:              [id number]    Pt. Type:                 O    Room/Bed:                 /    Admit/Disch:              07/02/18 08:24:00 -    Institution:       [facility] Case Attendance - Phase III                                                                                          Entry 1                                                                                                          Case Attendee             RAND-MD,  Real Cons         Role Performed                  Surgeon Primary    Last Modified By:         Anne Shutter  07/02/18 13:12:53      [facility] - Case Times - Phase III                                                                                             Entry 1                                                                                                          Phase III In              07/02/18 12:21:00               Phase III Out                   07/02/18 14:01:00    Phase III Discharge       07/02/18 14:01:00    Time     Last Modified By:         Gaston IslamSTOREN, RN, JOANNE L                              07/02/18 14:01:46              Finalized By: Walker KehrSTOREN, RN, JOANNE L      Document Signatures                                                                             Signed By:           Walker KehrSTOREN, RN, JOANNE L 07/02/18 14:01

## 2018-07-02 NOTE — H&P (Signed)
Radiology short H&P Carrie Shields        Patient:   Carrie Shields, Carrie Shields            MRN: [id number]            FIN: [id number]               Age:   [age] years     Sex:  Female     DOB:  01/31/1995   Associated Diagnoses:   None   Author:   Charletta Voight-MD,  Shela LeffJOHN CHRISMAN      Preoperative Information   intracranial hypertension      Chief Complaint   headaches      Review of Systems   Constitutional:  Negative.    Respiratory:  Negative.    Cardiovascular:  Negative.       Health Status   Allergies:    Allergic Reactions (Selected)  Moderate  Penicillin- Rash.  Unknown  Augmentin- Hives / itching.  Ertapenem- Hives / itching.,    Allergies (3) Active Reaction  penicillin Rash  Augmentin Hives / Itching  ertapenem Hives / Itching     Current medications:  (Selected)   Documented Medications  Documented  Excedrin Migraine: 1 tabs, Oral, q6hr, PRN: headache, 0 Refill(s)  Lexapro 20 mg oral tablet: 20 mg, 1 tabs, Oral, qAM, 30 tabs, 0 Refill(s)  Sprintec 0.25 mg-35 mcg oral tablet: 1 tabs, Oral, qAM, 28 tabs, 0 Refill(s)  UBRELVY: 100 mg, Oral, MIGRAINES, PRN, 0 Refill(s)  ZyrTEC 10 mg oral tablet: 10 mg, 1 tabs, Oral, Daily, PRN: as needed for allergy symptoms, 90 tabs, 0 Refill(s),    Home Medications (5) Active  Excedrin Migraine 1 tabs, PRN, Oral, q6hr  Lexapro 20 mg oral tablet 20 mg = 1 tabs, Oral, qAM  Sprintec 0.25 mg-35 mcg oral tablet 1 tabs, Oral, qAM  UBRELVY 100 mg, PRN, Oral  ZyrTEC 10 mg oral tablet 10 mg = 1 tabs, PRN, Oral, Daily  ,    No qualifying data available     Problem list:    Patient Stated (Selected)  Anxiety and depression / [id number] / Confirmed  Migraine / [id number] / Confirmed  Intracranial hypertension / [id number] / Confirmed  Seasonal allergies / [phone] / Confirmed  Wears glasses / [id number] / Confirmed,    Active Problems (5)  Anxiety and depression   Intracranial hypertension   Migraine   Seasonal allergies   Wears glasses         Histories   Past Medical History:     Resolved  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   Procedure history:    Ankle and/or Foot Incision and Drainage (Right) on 10/13/2017 at 22 Years.  Comments:  10/13/2017 13:00 EDT - ABELS, RN, PENNE Shields  auto-populated from documented surgical case  Ankle Achilles Tendon Repair (Right) on 07/26/2017 at 22 Years.  Comments:  07/26/2017 16:05 EDT - Stacie AcresFelice, RN, Lanora ManisElizabeth  auto-populated from documented surgical case  Ankle and/or Foot Incision and Drainage on 03/04/2015 at 19 Years.  Comments:  03/04/2015 15:04 EST - DANIEL, RN, MELISSA S  auto-populated from documented surgical case  Ankle Achilles Tendon Repair (Left) on 02/12/2015 at 19 Years.  Comments:  02/12/2015 18:26 EST - WELLS, RN, RON D  auto-populated from documented surgical case  Ankle and/or Foot Incision and Drainage (Left) on 02/12/2015 at 19 Years.  Comments:  02/12/2015 18:26 EST - WELLS, RN, RON D  auto-populated from documented surgical  case  Ankle Achilles Tendon Repair (Ankle, Left) on 01/19/2015 at 19 Years.  Comments:  01/19/2015 14:45 EST - ZEIF, RN, REBECCA N  auto-populated from documented surgical case  Extraction of wisdom tooth ([id number]) on 01/22/2014 at 63 Years.  Arthroscopy ([id number]) on 01/13/2013 at 17 Years.  Comments:  01/19/2015 9:40 EST - MCBRIDE, RN, ERIN R  RIGHT KNEE      Physical Examination   Vital Signs   0/62/3762 8:31 EDT Systolic Blood Pressure 517 mmHg    Diastolic Blood Pressure 64 mmHg    Temperature Oral 37.2 degC    Heart Rate Monitored 70 bpm    Respiratory Rate 17 br/min    SpO2 98 %    SBP/DBP Cuff Details Left arm         Vital Signs (last 24 hrs)_____  Last Charted___________  Temp Oral     37.2 degC  (JUN 19 08:30)  Resp Rate         17 br/min  (JUN 19 08:30)  SBP      105 mmHg  (JUN 19 08:30)  DBP      64 mmHg  (JUN 19 08:30)  SpO2      98 %  (JUN 19 08:30)  Weight      113.6 kg  (JUN 19 08:30)  Height      182.9 cm  (JUN 19  08:30)  BMI      33.96  (JUN 19 08:49)     Measurements from flowsheet : Measurements   07/02/2018 8:49 EDT Body Mass Index est meas 33.96 kg/m2   07/02/2018 8:49 EDT Body Mass Index Measured 33.96 kg/m2   07/02/2018 8:30 EDT Height/Length Measured 182.9 cm    Weight Measured 113.6 kg    Weight Dosing 113.6 kg      General:  Alert and oriented.    Respiratory:  Lungs are clear to auscultation.    Cardiovascular:  Normal rate, Regular rhythm.       Review / Management   Results review:     No qualifying data available, Lab results: 07/02/2018 8:49 EDT       Estimated Creatinine Clearance            179.24 mL/min  .       Impression and Plan   lumbar puncture with csf removal opening and closing preasures   Signature Line     Electronically Signed on 07/02/2018 12:04 PM EDT   ________________________________________________   Sanayah Munro-MD,  Real Cons

## 2018-07-02 NOTE — Assessment & Plan Note (Signed)
Pre-Procedure Record - [facility]             Pre-Procedure Record - [facility] Summary                                                             Primary Physician:        Drue StagerRAND-MD,  Carrie Shields    Case Number:              [id number]    Finalized Date/Time:      07/02/18 09:15:30    Pt. Name:                 Carrie Shields, Carrie Shields    D.O.B./Sex:               1995-04-16    Female    Med Rec #:                [id number]    Physician:                Carrie Shields,  Shela LeffJOHN CHRISMAN    Financial #:              [id number]    Pt. Type:                 O    Room/Bed:                 /    Admit/Disch:              07/02/18 08:24:00 -    Institution:       [facility] Case Attendance - Pre-Procedure                                                                                      Entry 1                         Entry 2                                                                          Case Attendee             Carrie Shields,  Christie BeckersJOHN CHRISMAN         STOREN, RN, Cora CollumJOANNE Shields    Role Performed            Surgeon Primary                 Preoperative Nurse    Time In     Time Out     Last Modified By:         Walker KehrSTOREN, RN, [name]  Anne Shutter                              07/02/18 08:35:29               07/02/18 08:35:29      [facility] - Case Times - Pre-Procedure                                                                                         Entry 1                                                                                                          Patient In Room Time      07/02/18 08:32:00               Nurse In Time                   07/02/18 08:35:00    Nurse Out Time            07/02/18 09:15:00               Patient Ready for               07/02/18 09:15:00                                                              Surgery/Procedure     Last Modified By:         Anne Shutter                              07/02/18 09:15:21              Finalized By: STOREN, RN, [place]                                                                              Signed By:           Anne Shutter 07/02/18 09:15

## 2018-07-02 NOTE — Procedures (Signed)
Lumbar Puncture Procedure        Patient:   Carrie Shields, Carrie Shields            MRN: [id number]            FIN: [id number]               Age:   23 years     Sex:  Female     DOB:  1995-10-29   Associated Diagnoses:   None   Author:   Khristie Sak-MD,  Jonny Ruiz [facility]      Procedure   Lumbar puncture procedure   Date/ Time:  07/02/2018 12:04:00.     Time Out: Completed.     Indication: drainage of increased CSF, measurement of CSF pressure, not diagnostic aspiration of fluid.     Preparation: Povidone-Iodine.     Technique: Needle size 22 gauge - 6 inches, Number of attempts === L4-L5.     Sedation: Yes.     Sterile Technique: Hand Hygiene, Sterile Drape, Sterile Gloves, Mask.     Findings: opening pressure  300  mm H2O, closing pressure  130  mm H2O, 21  ml fluid obtained.     Procedure tolerated: well.     lumbar puncture opening and closing pressure's, csf removal   Signature Line     Electronically Signed on 07/02/2018 12:07 PM EDT   ________________________________________________   Javante Nilsson-MD,  Shela Leff

## 2018-07-02 NOTE — Discharge Summary (Signed)
 Inpatient Patient Summary               [facility]  [address]  [place], [place] [address]  [phone]  Patient Discharge Instructions    Name: Carrie Shields, Carrie Shields  Current Date: 07/02/2018 13:17:59  DOB: 1995-11-14 MRN: [id number] FIN: NBR%[phone]  Patient Address: [address] [place] [address]  Patient Phone: [phone]  Primary Care Provider:  Name: JEANNETTE DOUGLAS NORLEEN VICENTA  Phone: [phone]   Immunizations Provided:      Discharge Diagnosis:   Discharged To: TO, ANTICIPATED%>  Home Treatments: TREATMENTS, ANTICIPATED%>  Devices/Equipment: EQUIPMENT REHAB%>  Post Hospital Services: HOSPITAL SERVICES%>  Professional Skilled Services: SKILLED SERVICES%>  Therapist, sports and Community Resources: SERV AND COMM RES, ANTICIPATED%>  Mode of Discharge Transportation: TRANSPORTATION%>  Discharge Orders          Discharge Patient 07/02/18 14:00:00 EDT, Discharge Home/Self Care        Comment:     Medications   During the course of your visit, your medication list was updated with the most current information. The details of those changes are reflected below:          Medications That Have Not Changed  Other Medications  acetaminophen /aspirin/caffeine (Excedrin Migraine) 1 Tabs Oral (given by mouth) every 6 hours as needed headache., MAX DAILY DOSE OF ACETAMINOPHEN  = 4000 MG  Last Dose:____________________  cetirizine (ZyrTEC 10 mg oral tablet) 1 Tabs Oral (given by mouth) every day as needed as needed for allergy symptoms.  Last Dose:____________________  escitalopram (Lexapro 20 mg oral tablet) 1 Tabs Oral (given by mouth) once a day (in the morning).  Last Dose:____________________  Non-Formulary Medication (UBRELVY) 100 Milligram Oral (given by mouth) as needed. MIGRAINES.  Last Dose:____________________  norgestimate-ethinyl estradiol (Sprintec 0.25 mg-35 mcg oral tablet) 1 Tabs Oral (given by mouth) once a day (in the morning).  Last Dose:____________________        [facility] would like to thank you for allowing us  to assist you with your healthcare needs. The following includes patient education materials and information regarding your injury/illness.    [name], [name] has been given the following list of follow-up instructions, prescriptions, and patient education materials:  Follow-up Instructions:            It is important to always keep an active list of medications available so that you can share with other providers and manage your medications appropriately. As an additional courtesy, we are also providing you with your final active medications list that you can keep with you.           acetaminophen /aspirin/caffeine (Excedrin Migraine) 1 Tabs Oral (given by mouth) every 6 hours as needed headache., MAX DAILY DOSE OF ACETAMINOPHEN  = 4000 MG  cetirizine (ZyrTEC 10 mg oral tablet) 1 Tabs Oral (given by mouth) every day as needed as needed for allergy symptoms.  escitalopram (Lexapro 20 mg oral tablet) 1 Tabs Oral (given by mouth) once a day (in the morning).  Non-Formulary Medication (UBRELVY) 100 Milligram Oral (given by mouth) as needed. MIGRAINES.  norgestimate-ethinyl estradiol (Sprintec 0.25 mg-35 mcg oral tablet) 1 Tabs Oral (given by mouth) once a day (in the morning).      Take only the medications listed above. Contact your doctor prior to taking any medications not on this list.      Discharge instructions, if any, will display below    Instructions for Diet: INSTRUCTIONS FOR DIET%>   Instructions for Supplements: SUPPLEMENT  INSTRUCTIONS%>   Instructions for Activity: INSTRUCTIONS FOR ACTIVITY%>   Instructions for Wound Care: INSTRUCTIONS FOR WOUND CARE%>    Medication leaflets, if any, will display below     Patient education materials, if any, will display below               Lumbar Puncture (LP) Discharge Instructions  Home care  Follow these tips when caring for yourself at home:   Once at home, rest for the remainder of the day.    It will take your  body about 6 hours to replenish the small amount of spinal fluid that may have been removed today. You may develop a headache during this time. For relief, lie down and also drink plenty of fluids.   Resume your regular diet.   No driving for 24 hours.   Remove dressing in 24 hours, apply bandaid as needed.   May shower in 24 hours, no tub bath or swimming until site is well healed.   If any of your regular medicines were stopped for this procedure, start taking them again tomorrow.   You may use acetaminophen  or ibuprofen for pain relief, unless another medicine was prescribed. If you have chronic liver, kidney disease, or a history of stomach ulcers or GI bleeding, talk with your provider before taking these medications.   Follow-up care  Call your health care provider for a follow-up appointment   When to seek medical advice  Call your health care provider right away if any of these occur:   Head or neck pain that gets worse   You feel less alert or have difficulty waking up   Repeated vomiting   Swelling, drainage or redness at the puncture site   Fever above 100.28F (38C)  Please call the Radiology department at [phone] for any problems/questions related to the procedure.  Call 911 for any Emergency.              IS IT A STROKE? Act FAST and Check for these signs:    FACE                         Does the face look uneven?    ARM                         Does one arm drift down?    SPEECH                    Does their speech sound strange?    TIME                         Call 9-1-1 at any sign of stroke  Heart Attack Signs  Chest discomfort: Most heart attacks involve discomfort in the center of the chest and lasts more than a few minutes, or goes away and comes back. It can feel like uncomfortable pressure, squeezing, fullness or pain.  Discomfort in upper body: Symptoms can include pain or discomfort in one or both arms, back, neck, jaw or stomach.  Shortness of breath: With or without  discomfort.  Other signs: Breaking out in a cold sweat, nausea, or lightheaded.  Remember, MINUTES DO MATTER. If you experience any of these heart attack warning signs, call 9-1-1 to get immediate medical attention!     ---------------------------------------------------------------------------------------------------------------------  [facility] allows you to manage your health, view your test results, and  retrieve your discharge documents from your hospital stay securely and conveniently from your computer.  To begin the enrollment process, visit [email or web address] Click on "Sign up now" under [facility].

## 2018-10-05 NOTE — Nursing Note (Signed)
Adult Admission Assessment - Text       Perioperative Admission Assessment Entered On:  10/05/2018 9:45 EDT    Performed On:  10/05/2018 9:44 EDT by Nedra Hai RN, [place] Herb               General   Call Complete :   10/06/2018 14:26 EDT   Reuel Boom RN, Derry Skill - 10/06/2018 14:10 EDT   Call Start :   10/06/2018 14:09 EDT   Languages :   [place]   Preferred Communication Mode :   Verbal   Information Given By :   Darla Lesches, RN, Derry Skill - 10/06/2018 14:09 EDT         Primary Care Physician/Specialists :   DR. Cyril Mourning, PCP  DR. [place] [facility]   Emergency Contact Name :   CHERELYN HARBOLD- MOTHER   Emergency Contact Phone :   [phone], CELL   Ed Blalock - 10/05/2018 9:44 EDT   Allergies   (As Of: 10/11/2018 07:31:23 EDT)   Allergies (Active)   Augmentin  Estimated Onset Date:   Unspecified ; Reactions:   Hives / Itching ; Created By:   Lady Gary, RN, [name]; Reaction Status:   Active ; Category:   Drug ; Substance:   Augmentin ; Type:   Allergy ; Severity:   Unknown ; Updated By:   Lady Gary, RN, Charm Barges; Reviewed Date:   10/11/2018 7:29 EDT      ertapenem  Estimated Onset Date:   Unspecified ; Reactions:   Hives / Itching ; Created By:   Lady Gary, RN, [name]; Reaction Status:   Active ; Category:   Drug ; Substance:   ertapenem ; Type:   Allergy ; Severity:   Unknown ; Updated By:   Lady Gary RN, Charm Barges; Reviewed Date:   10/11/2018 7:29 EDT      penicillin  Estimated Onset Date:   Unspecified ; Reactions:   Rash ; Created By:   Jorge Mandril, RN, [name]; Reaction Status:   Active ; Category:   Drug ; Substance:   penicillin ; Type:   Allergy ; Severity:   Moderate ; Updated By:   Jorge Mandril, RN, Vista Lawman; Reviewed Date:   10/11/2018 7:29 EDT        Medication History   Medication List   (As Of: 10/11/2018 07:31:23 EDT)   Home Meds    acetaZOLAMIDE  :   acetaZOLAMIDE ; Status:   Documented ; Ordered As Mnemonic:   acetaZOLAMIDE 250 mg oral tablet ; Simple Display Line:   750 mg, 3 tabs, Oral, Daily, 2 TABS HS, 0 Refill(s) ;  Catalog Code:   acetaZOLAMIDE ; Order Dt/Tm:   10/06/2018 14:24:51 EDT          cetirizine  :   cetirizine ; Status:   Documented ; Ordered As Mnemonic:   ZyrTEC 10 mg oral tablet ; Simple Display Line:   10 mg, 1 tabs, Oral, Daily, PRN: as needed for allergy symptoms, 90 tabs, 0 Refill(s) ; Catalog Code:   cetirizine ; Order Dt/Tm:   03/03/2015 11:01:07 EST          lamoTRIgine  :   lamoTRIgine ; Status:   Documented ; Ordered As Mnemonic:   lamoTRIgine 100 mg oral tablet ; Simple Display Line:   100 mg, 1 tabs, Oral, Daily, 0 Refill(s) ; Catalog Code:   lamoTRIgine ; Order Dt/Tm:   10/06/2018 14:23:52 EDT  Problem History   (As Of: 10/11/2018 07:31:23 EDT)   Problems(Active)    Anxiety and depression (SNOMED CT  :93235573 )  Name of Problem:   Anxiety and depression ; Recorder:   Lady Gary, RN, [name]; Confirmation:   Confirmed ; Classification:   Patient Stated ; Code:   [id number] ; Contributor System:   PowerChart ; Last Updated:   10/09/2017 9:08 EDT ; Life Cycle Date:   10/09/2017 ; Life Cycle Status:   Active ; Vocabulary:   SNOMED CT        Intracranial hypertension (SNOMED CT  :[id number] )  Name of Problem:   Intracranial hypertension ; Recorder:   LUTZ, RN, [name]; Confirmation:   Confirmed ; Classification:   Patient Stated ; Code:   [id number] ; Contributor System:   Dietitian ; Last Updated:   06/29/2018 15:20 EDT ; Life Cycle Date:   06/29/2018 ; Life Cycle Status:   Active ; Vocabulary:   SNOMED CT        Migraine (SNOMED CT  :37106269 )  Name of Problem:   Migraine ; Recorder:   Jorge Mandril, RN, Vista Lawman; Confirmation:   Confirmed ; Classification:   Patient Stated ; Code:   [id number] ; Contributor System:   PowerChart ; Last Updated:   06/14/2018 14:33 EDT ; Life Cycle Date:   06/14/2018 ; Life Cycle Status:   Active ; Vocabulary:   SNOMED CT        Seasonal allergies (SNOMED CT  :[phone] )  Name of Problem:   Seasonal allergies ; Recorder:   MCBRIDE, RN, ERIN R; Confirmation:    Confirmed ; Classification:   Patient Stated ; Code:   [phone] ; Contributor System:   PowerChart ; Last Updated:   01/19/2015 9:39 EST ; Life Cycle Date:   01/19/2015 ; Life Cycle Status:   Active ; Vocabulary:   SNOMED CT        Wears glasses (SNOMED CT  :267124580 )  Name of Problem:   Wears glasses ; Recorder:   LUTZ, RN, [name]; Confirmation:   Confirmed ; Classification:   Patient Stated ; Code:   [id number] ; Contributor System:   Dietitian ; Last Updated:   06/29/2018 15:20 EDT ; Life Cycle Date:   06/29/2018 ; Life Cycle Status:   Active ; Vocabulary:   SNOMED CT          Procedure History        -    Procedure History   (As Of: 10/11/2018 07:31:23 EDT)     Procedure Dt/Tm:   01/22/2014 ; Anesthesia Minutes:   0 ; Procedure Name:   Extraction of wisdom tooth ; Procedure Minutes:   0            Procedure Dt/Tm:   01/13/2013 ; Anesthesia Minutes:   0 ; Procedure Name:   Arthroscopy ; Procedure Minutes:   0 ; Comments:     01/19/2015 9:40 EST - MCBRIDE, RN, ERIN R  RIGHT KNEE            Procedure Dt/Tm:   01/19/2015 13:19:00 EST ; Location:   [facility] OR ; Provider:   LAMB-MD,  JOSHUA H; Anesthesia Type:   General ; :   FERLA-MD,  BRIAN P; Anesthesia Minutes:   0 ; Procedure Name:   Ankle Achilles Tendon Repair (Ankle, Left) ; Procedure Minutes:   75 ; Comments:     01/19/2015 14:45 EST - ZEIF, RN, REBECCA N  auto-populated from  documented surgical case ; Clinical Service:   Surgery            Procedure Dt/Tm:   02/12/2015 16:52:00 EST ; Location:   [facility] OR ; Provider:   LAMB-MD,  JOSHUA H; Anesthesia Type:   General ; :   FERLA-MD,  BRIAN P; Anesthesia Minutes:   0 ; Procedure Name:   Ankle Achilles Tendon Repair (Left) ; Procedure Minutes:   68 ; Comments:     02/12/2015 18:26 EST - Anner Crete, RN, RON D  auto-populated from documented surgical case ; Clinical Service:   Surgery            Procedure Dt/Tm:   02/12/2015 16:52:00 EST ; Location:   [facility] OR ; Provider:   LAMB-MD,  JOSHUA H; Anesthesia Type:    General ; :   FERLA-MD,  BRIAN P; Anesthesia Minutes:   0 ; Procedure Name:   Ankle and/or Foot Incision and Drainage (Left) ; Procedure Minutes:   70 ; Comments:     02/12/2015 18:26 EST - Anner Crete, RN, RON D  auto-populated from documented surgical case ; Clinical Service:   Surgery            Procedure Dt/Tm:   03/04/2015 13:36:00 EST ; Location:   [facility] OR ; Provider:   Sibyl Parr; Anesthesia Type:   General ; :   Jenetta Loges; Anesthesia Minutes:   0 ; Procedure Name:   Ankle and/or Foot Incision and Drainage ; Procedure Minutes:   1502 ; Comments:     03/04/2015 15:04 EST - DANIEL, RN, MELISSA S  auto-populated from documented surgical case ; Clinical Service:   Surgery            Procedure Dt/Tm:   07/26/2017 15:26:00 EDT ; Location:   [facility] ; Provider:   Marcine Matar; Anesthesia Type:   General ; :   PENDARVIS-MD, JR, ALLEN E; Anesthesia Minutes:   0 ; Procedure Name:   Ankle Achilles Tendon Repair (Right) ; Procedure Minutes:   31 ; Comments:     07/26/2017 16:05 EDT - Stacie Acres, RN, Elizabeth  auto-populated from documented surgical case ; Clinical Service:   Surgery            Procedure Dt/Tm:   10/13/2017 12:32:00 EDT ; Location:   [facility] OR ; Provider:   Marcine Matar; Anesthesia Type:   General ; :   CHOI-MD,  YOUNG; Anesthesia Minutes:   0 ; Procedure Name:   Ankle and/or Foot Incision and Drainage (Right) ; Procedure Minutes:   20 ; Comments:     10/13/2017 13:00 EDT - ABELS, RN, PENNE L  auto-populated from documented surgical case ; Clinical Service:   Surgery            Anesthesia Minutes:   0 ; Procedure Name:   LUMBAR PUNCTURE ; Procedure Minutes:   0            Anesthesia/Sedation   Anesthesia History :   Prior general anesthesia   SN - Malignant Hyperthermia :   Denies   Previous Problem with Anesthesia :   None   Moderate Sedation History :   Prior sedation for procedure   Previous Problem With Sedation :   None   Symptoms of Sleep Apnea :   No OSA Symptoms   Symptoms of Sleep Apnea  Score :   0    Shortness of Breath Indicator :   No shortness  of breath   Pregnancy Status :   Patient denies   Last Menstrual Period :   08/12/2018 EDT   Reuel Boom, RN, Derry Skill - 10/06/2018 14:10 EDT   Bloodless Medicine   Is Blood Transfusion Acceptable to Patient :   Yes   Reuel Boom, RN, Derry Skill - 10/06/2018 14:10 EDT   ID Risk Screen Symptoms   Recent Travel History :   No recent travel   Close Contact with COVID-19 ID :   Preadmission testing patients only   Last 14 days COVID-19 ID :   No   TB Symptom Screen :   No symptoms   C. diff Symptom/History ID :   Neither of the above   Audie Box - 10/06/2018 14:10 EDT   ID COVID-19 Screen   Fever OR Chills :   No   Headache :   No   New or Worsening Cough :   No   Fatigue :   No   Shortness of Breath ID :   No   Myalgia (Muscle Pain) :   No   Dyspnea :   No   Diarrhea :   No   Sore Throat :   No   Nausea :   No   Laryngitis :   No   Sudden Loss of Taste or Smell :   No   Audie Box - 10/06/2018 14:10 EDT   Social History   Social History   (As Of: 10/11/2018 07:31:23 EDT)   Tobacco:        Never smoker   (Last Updated: 02/10/2015 16:37:45 EST by Colon Branch, RN, MELISSA)          Alcohol:        Denies   (Last Updated: 01/19/2015 09:41:36 EST by Adin Hector, RN, ERIN R)          Substance Use:        Denies   (Last Updated: 07/26/2017 12:21:43 EDT by Elijah Birk, RN, TARA J)            Advance Directive   Advance Directive :   No   Reuel Boom RN, Derry Skill - 10/06/2018 14:10 EDT   PAT Patient Instructions   Patient Arrival Time PAT :   10/06/2018 7:00 EDT   Medications in AM :   BRING MEDS   Medication Understanding :   Trenton Gammon understanding   NPO PAT :   NPO after midnight   Reuel Boom RN, Derry Skill - 10/06/2018 14:10 EDT   PAT Instructions Grid   Make up Understanding :   Trenton Gammon understanding   Jewelry Understanding :   Verbalizes understanding   Perfume Understanding :   Verbalizes understanding   Valuables Understanding :   Verbalizes understanding   Clothing Understanding :    Verbalizes understanding   Contact MD for Illness :   Financial trader MD for skin injury :   Verbalizes understanding   Reuel Boom, RN, Derry Skill - 10/06/2018 14:10 EDT   Service Line PAT :   IR   Laterality PAT :   N/A   Prep PAT :   Other: DIAL SOAP   Name of Contact PAT :   [name]   Relationship of Contact PAT :   MOTHER   Contact Number PAT :   [phone]   Transportation Instructions PAT :   Accompany to Hospital, Transport Home, Remain with 24 hours post-procedure   Additional Comments PAT :  BRING MEDS IN CASE THEY WANT YOU TO TAKE   Additional Contacts PAT :   COVID TEST 9/24  VISITOR POLICY   Reuel Boom RN, Derry Skill - 10/06/2018 14:10 EDT     Harm Screen   Injuries/Abuse/Neglect in Household :   Denies   Feels Unsafe at Home :   No   Velia Meyer - 10/11/2018 7:29 EDT

## 2018-10-09 LAB — COVID-19: SARS-CoV-2: NOT DETECTED

## 2018-10-11 NOTE — Assessment & Plan Note (Signed)
Pre-Procedure Record - [facility]             Pre-Procedure Record - [facility] Summary                                                             Primary Physician:        Precious Haws    Case Number:              [id number]    Finalized Date/Time:      10/11/18 07:45:58    Pt. Name:                 Carrie Shields, Carrie Shields    D.O.B./Sex:               10-24-1995    Female    Med Rec #:                [id number]    Physician:                Buren Kos    Financial #:              [id number]    Pt. Type:                 O    Room/Bed:                 /    Admit/Disch:              10/11/18 07:00:00 -    Institution:       [facility] Case Attendance - Pre-Procedure                                                                                      Entry 1                                                                                                          Case Attendee             Tito Dine F            Role Performed                  Surgeon Primary    Last Modified By:         Earlene Plater, RN, Mordecai Maes  10/11/18 07:23:07      [facility] - Case Times - Pre-Procedure                                                                                         Entry 1                                                                                                          Patient In Room Time      10/11/18 07:10:00               Nurse In Time                   10/11/18 07:21:00    Nurse Out Time            10/11/18 07:35:00               Patient Ready for               10/11/18 07:35:00                                                              Surgery/Procedure     Last Modified By:         Rosana Hoes, RN, Therese                              10/11/18 07:45:47              Finalized By: Rosana Hoes RN, Therese      Document Signatures                                                                             Signed By:           Rosana Hoes, RN, [facility] 10/11/18 07:45

## 2018-10-11 NOTE — H&P (Signed)
Preoperative H&P: General *        Patient:   Carrie Shields, Carrie Shields            MRN: [id number]            FIN: [id number]               Age:   23 years     Sex:  Female     DOB:  [birth year]   Associated Diagnoses:   None   Author:   Elmer Sow F      Chief Complaint   IIH with return of symptoms about 2 weeks ago.  Last LP in June 2020.      Health Status   Allergies:    Allergic Reactions (Selected)  Moderate  Penicillin- Rash.  Unknown  Augmentin- Hives / itching.  Ertapenem- Hives / itching.,    Allergies (3) Active Reaction  penicillin Rash  Augmentin Hives / Itching  ertapenem Hives / Itching     Current medications:  (Selected)   Inpatient Medications  Ordered  Tylenol: 650 mg, 2 tabs, Oral, Once, PRN: mild pain (1-3)  Documented Medications  Documented  ZyrTEC 10 mg oral tablet: 10 mg, 1 tabs, Oral, Daily, PRN: as needed for allergy symptoms, 90 tabs, 0 Refill(s)  acetaZOLAMIDE 250 mg oral tablet: 750 mg, 3 tabs, Oral, Daily, 2 TABS HS, 0 Refill(s)  lamoTRIgine 100 mg oral tablet: 100 mg, 1 tabs, Oral, Daily, 0 Refill(s),    Home Medications (3) Active  acetaZOLAMIDE 250 mg oral tablet 750 mg = 3 tabs, Oral, Daily  lamoTRIgine 100 mg oral tablet 100 mg = 1 tabs, Oral, Daily  ZyrTEC 10 mg oral tablet 10 mg = 1 tabs, PRN, Oral, Daily  ,    Medications (1) Active  Scheduled: (0)  Continuous: (0)  PRN: (1)  acetaminophen 325 mg Tab  650 mg 2 tabs, Oral, Once     Problem list:    Patient Stated (Selected)  Anxiety and depression / [id number] / Confirmed  Migraine / [id number] / Confirmed  Intracranial hypertension / [id number] / Confirmed  Seasonal allergies / [phone] / Confirmed  Wears glasses / [id number] / Confirmed,    Active Problems (5)  Anxiety and depression   Intracranial hypertension   Migraine   Seasonal allergies   Wears glasses         Histories   Past Medical History:    Resolved  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   Family History:    No family history items have been selected or recorded.   Procedure history:    Ankle and/or Foot Incision and Drainage (Right) on 10/13/2017 at 22 Years.  Comments:  10/13/2017 13:00 EDT - ABELS, RN, PENNE L  auto-populated from documented surgical case  Ankle Achilles Tendon Repair (Right) on 07/26/2017 at 22 Years.  Comments:  9/44/9675 91:63 EDT - Loma Boston, RN, Benjamine Mola  auto-populated from documented surgical case  Ankle and/or Foot Incision and Drainage on 03/04/2015 at 19 Years.  Comments:  03/04/2015 15:04 EST - DANIEL, RN, MELISSA S  auto-populated from documented surgical case  Ankle Achilles Tendon Repair (Left) on 02/12/2015 at 87 Years.  Comments:  02/12/2015 18:26 EST - WELLS, RN, RON D  auto-populated from documented surgical case  Ankle and/or Foot Incision and Drainage (Left) on 02/12/2015 at 19 Years.  Comments:  02/12/2015 18:26 EST - WELLS, RN, RON D  auto-populated from documented surgical case  Ankle Achilles Tendon Repair (Ankle, Left) on 01/19/2015 at 19 Years.  Comments:  01/19/2015 14:45 EST - ZEIF, RN, REBECCA N  auto-populated from documented surgical case  Extraction of wisdom tooth ([id number]) on 01/22/2014 at 10 Years.  Arthroscopy ([id number]) on 01/13/2013 at 17 Years.  Comments:  01/19/2015 9:40 EST - MCBRIDE, RN, ERIN R  RIGHT KNEE  LUMBAR PUNCTURE.   Social History        Social & Psychosocial Habits    Alcohol  01/19/2015  Use: Denies    Substance Use  07/26/2017  Use: Denies    Tobacco  02/10/2015  Use: Never smoker  .        Physical Examination   Vital Signs   9/56/2130 86:57 EDT Systolic Blood Pressure 846 mmHg    Diastolic Blood Pressure 62 mmHg    Heart Rate Monitored 62 bpm    Respiratory Rate 20 br/min    SpO2 99 %   9/62/9528 4:13 EDT Systolic Blood Pressure 244 mmHg    Diastolic Blood Pressure 74 mmHg    Temperature Oral 37 degC    Heart Rate Monitored 59 bpm  LOW    Respiratory Rate 20 br/min    SpO2 98 %         Vital  Signs (last 24 hrs)_____  Last Charted___________  Temp Oral     37 degC  (SEP 28 07:25)  Resp Rate         20 br/min  (SEP 28 10:20)  SBP      113 mmHg  (SEP 28 10:20)  DBP      62 mmHg  (SEP 28 10:20)  SpO2      99 %  (SEP 28 10:20)  Weight      118.2 kg  (SEP 28 07:25)  Height      182.9 cm  (SEP 28 07:25)  BMI      35.33  (SEP 28 07:28)     Measurements from flowsheet : Measurements   10/11/2018 7:28 EDT Body Mass Index est meas 35.33 kg/m2    Body Mass Index Measured 35.33 kg/m2   10/11/2018 7:25 EDT Height/Length Measured 182.9 cm    Weight Measured 118.2 kg      General:  Alert and oriented, No acute distress.    HENT:  Normocephalic.    Respiratory:  Respirations are non-labored.    Neurologic:  Alert, Oriented.    Psychiatric:  Cooperative, Appropriate mood & affect.       Health Maintenance      Health Maintenance     Pending (in the next year)        Due            Influenza Vaccine due  09/13/18  and every 1  years           Cervical Cancer Screening due  10/11/18  and every 3  years           HPV Vaccine Dose 1 due  10/11/18  One-time only           Hepatitis A Dose 1 due  10/11/18  One-time only           Hepatitis B Dose 1 due  10/11/18  One-time only           MMR Vaccine Dose 1 due  10/11/18  One-time only           Meningococcal Dose 1 due  10/11/18  One-time only  Pertussis Vaccine due  10/11/18  One-time only           Tetanus/TD Vaccine due  10/11/18  and every 10  years           Varicella Vaccine Dose 1 due  10/11/18  One-time only     Satisfied (in the past 1 year)     There are no satisfied recommendations within the defined date range          Review / Management   Results review:     No qualifying data available, Lab results: 10/11/2018 7:28 EDT       Estimated Creatinine Clearance            179.24 mL/min  .       Impression and Plan   Diagnosis     Assessment: IIH  Plan: LP.     Signature Line     Electronically Signed on 10/11/2018 10:45 AM EDT    ________________________________________________   Wallis Mart

## 2018-10-11 NOTE — Op Note (Signed)
Recovery Phase III Record - [facility]             Recovery Phase III Record - [facility] Summary                                                        Primary Physician:        Precious Haws    Case Number:              [id number]    Finalized Date/Time:      10/11/18 14:05:19    Pt. Name:                 Carrie Shields, Carrie Shields    D.O.B./Sex:               [birth year]    Female    Med Rec #:                [id number]    Physician:                Buren Kos    Financial #:              [id number]    Pt. Type:                 O    Room/Bed:                 /    Admit/Disch:              10/11/18 07:00:00 -    Institution:       [facility] Case Attendance - Phase III                                                                                          Entry 1                                                                                                          Case Attendee             Tito Dine F            Role Performed                  Surgeon Primary    Last Modified By:         Earlene Plater, RN, Mordecai Maes  10/11/18 10:29:52      [facility] - Case Times - Phase III                                                                                             Entry 1                                                                                                          Phase III In              10/11/18 10:20:00               Phase III Out                   10/11/18 12:00:00    Phase III Discharge       10/11/18 12:00:00    Time     Last Modified By:         Rosana Hoes, RN, [place]                              10/11/18 14:05:15              Finalized By: Rosana Hoes RN, [facility]      Document Signatures                                                                             Signed By:           Rosana Hoes, RN, [facility] 10/11/18 14:05

## 2018-10-11 NOTE — Procedures (Signed)
Lumbar Puncture Procedure        Patient:   Carrie Shields, Carrie Shields            MRN: [id number]            FIN: [id number]               Age:   23 years     Sex:  Female     DOB:  1995/10/19   Associated Diagnoses:   None   Author:   Tito Dine F      Procedure   Lumbar puncture procedure   Date/ Time:  10/11/2018 10:00:00.     Time Out: Completed.     Indication: drainage of increased CSF.     Preparation: Povidone-Iodine.     Technique: Paraspinous, Needle size 20 gauge - 6 inches.     Sedation: Yes.     Sterile Technique: Hand Hygiene, Sterile Drape, Sterile Gloves, Mask.     Findings: opening pressure  380  mm H2O, closing pressure  180  mm H2O, 29  ml fluid obtained.     Procedure tolerated: well.     Signature Line     Electronically Signed on 10/11/2018 10:43 AM EDT   ________________________________________________   Precious Haws

## 2019-01-05 NOTE — ED Notes (Signed)
 ED Patient Summary       ;          [facility]  [address] [place], [place] [address]  [phone]  Discharge Instructions (Patient)  _______________________________________     Carrie Shields  DOB:  [birth year]                   MRN: [id number]                   FIN: [id number]  Reason For Visit: Vision changes; BLURRED VISION  Final Diagnosis: Idiopathic intracranial hypertension     Visit Date: 01/05/2019 21:53:00  Address: [address] [place] [address]  Phone: [phone]     Emergency Department Providers:         Primary Physician:   DEETTE KIRSCH [facility] would like to thank you for allowing us  to assist you with your healthcare needs. The following includes patient education materials and information regarding your injury/illness.     Follow-up Instructions:  You were seen today on an emergency basis. Please contact your primary care doctor for a follow up appointment. If you received a referral to a specialist doctor, it is important you follow-up as instructed.    It is important that you call your follow-up doctor to schedule and confirm the location of your next appointment. Your doctor may practice at multiple locations. The office location of your follow-up appointment may be different to the one written on your discharge instructions.    If you do not have a primary care doctor, please call [phone] for help in finding a [facility] Provider. For help in finding a specialist doctor, please call [phone].    The [facility] "Ask a Nurse" line in staffed by Registered Nurses and is a free service to the community. We are available Monday - Friday from 8am to 5pm to answer your questions about your health. Please call [phone].    If your condition gets worse before your follow-up with your primary care doctor or specialist, please return to the Emergency  Department.        Follow Up Appointments:  Primary Care Provider:      Name: JEANNETTE DOUGLAS NORLEEN VICENTA      Phone: [phone]                 With: Address: When:   PCP  Within 1 week   Comments:   If you do not have a PCP you may find one by calling our [facility] referral line at [phone] or you may see attached clinic list.   Return to ED if symptoms worsen. Take all your regular medications as prescribed. Follow-up with your neurologist as scheduled.              Printed Prescriptions:    Patient Education Materials:  Discharge Orders          Discharge Patient 01/05/19 23:21:00 EST         Comment:      Pseudotumor Cerebri; Idiopathic Intracranial Hypertension     Pseudotumor Cerebri    Pseudotumor cerebri, also called idiopathic intracranial hypertension, is a condition that occurs due to increased pressure within your skull. ?Although some of the symptoms resemble those of a brain tumor, it is not a brain tumor. Symptoms occur when the increased pressure in  your skull compresses brain structures. For example, pressure on the nerve responsible for vision (optic nerve) causes it to swell, resulting in visual symptoms.    Pseudotumor cerebri tends to occur in obese women younger than 21 years of age. However, men and children can also develop this condition.    SYMPTOMS    Symptoms of pseudotumor cerebri occur due to increased pressure within the skull. Symptoms may include:      Headaches.     Nausea and vomiting.      Dizziness.     High blood pressure. ?     Ringing in the ears.     Double or blurred vision.     Brief episodes of complete loss of vision.      Pain in the back, neck, or shoulders.    DIAGNOSIS    Pseudotumor cerebri is diagnosed through:     A detailed eye exam, which can reveal a swollen optic nerve, as well as identifying issues such as blind spots in the vision.      An MRI or CT scan to rule out other disorders that can cause similar symptoms, such as brain tumors.     A spinal  tap (lumbar puncture), which can demonstrate increased pressure within the skull.    TREATMENT    There are several ways that pseudotumor cerebri is treated, including:     Medicines to decrease the production of spinal fluid and lower the pressure within your skull.     Medicines to prevent or treat headaches.      Surgery to create an opening in your optic nerve to allow excess fluid to drain out.     Surgery to place drains (shunts) in your brain to remove excess fluid.    HOME CARE INSTRUCTIONS     Take all medicines as directed by your health care provider.     Go to all of your follow-up appointments.      Lose weight if you are overweight.    SEEK MEDICAL CARE IF:     Any symptoms come back.      You develop trouble with hearing, vision, balance, or your sense of smell.      You cannot eat or drink what you need.     You are more weak or tired than usual. ?     You are losing weight without trying.    SEEK IMMEDIATE MEDICAL CARE IF:     You have new symptoms such as vision problems or difficulty walking. ?     You have a seizure. ?     You have trouble breathing. ?     You have a fever.     ?    This information is not intended to replace advice given to you by your health care provider. Make sure you discuss any questions you have with your health care provider.    Document Released: 01/04/2013 Document Reviewed: 01/04/2013  Elsevier Interactive Patient Education ?2016 Elsevier Inc.       Idiopathic Intracranial Hypertension    Idiopathic intracranial hypertension (IIH) is a neurologic disorder that leads to increased pressure around your brain. It can cause vision loss and blindness if left untreated.    RISK FACTORS    IIH is most common in very overweight (obese) women of childbearing age.    SIGNS AND SYMPTOMS    Symptoms of IIH include:     Headache.     Feeling of sickness in  your stomach (nausea).     Vomiting.     A rushing of water sound within your ears (pulsatile tinnitus).      Double vision.      DIAGNOSIS    Idiopathic intracranial hypertension is diagnosed with the aid of different exams:     Brain scans such as:    ? CT.    ? MRI.     ? MRV.     Diagnostic lumbar puncture. This procedure can determine if there is too much spinal fluid within the central nervous system. Too much spinal fluid can increase intracranial pressure.      A thorough eye exam will be done to look for swelling within the eyes. Visual field testing will also be done to see if any damage has occurred to nerves in the eyes.     TREATMENT    Treatment of idiopathic intracranial hypertension is based on symptoms. Common treatments include:     Lumbar puncture to remove excess spinal fluid.      Medicine.      Surgery.     HOME CARE INSTRUCTIONS    The most important thing anyone can do to improve this condition is lose weight if they are overweight.     SEEK MEDICAL CARE IF:     You have changes in vision.     You have double vision.     You have loss of color vision.    SEEK IMMEDIATE MEDICAL CARE IF:     Your headaches get worse rather than better.      Nausea or vomiting or both continue after treatment.      Your vision does not improve or gets worse after treatment.     MAKE SURE YOU:     Understand these instructions.     Will watch your condition.     Will get help right away if you are not doing well or get worse.    This information is not intended to replace advice given to you by your health care provider. Make sure you discuss any questions you have with your health care provider.    Document Released: 03/10/2001 Document Revised: 01/04/2013 Document Reviewed: 09/06/2012  Elsevier Interactive Patient Education ?2016 Elsevier Inc.         Allergy Info: Augmentin; ertapenem; penicillin     Medication Information:  [facility] ED Physicians provided you with a complete list of medications post discharge, if you have been instructed to stop taking a medication please ensure you also follow up with  this information to your Primary Care Physician.  Unless otherwise noted, patient will continue to take medications as prescribed prior to the Emergency Room visit.  Any specific questions regarding your chronic medications and dosages should be discussed with your physician(s) and pharmacist.          acetaZOLAMIDE (acetaZOLAMIDE 250 mg oral tablet) 3 Tabs Oral (given by mouth) every day. 2 TABS HS.  cetirizine (ZyrTEC 10 mg oral tablet) 1 Tabs Oral (given by mouth) every day as needed as needed for allergy symptoms.  escitalopram (escitalopram 20 mg oral tablet) TAKE 1 TABLET BY MOUTH EVERY MORNING.  lamoTRIgine (lamoTRIgine 100 mg oral tablet) 1 Tabs Oral (given by mouth) every day.      Medications Administered During Visit:              Medication Dose Route   ketorolac 15 mg IV Push   diazepam 5 mg IV  Push   metoclopramide 10 mg IV Push          Major Tests and Procedures:  The following procedures and tests were performed during your ED visit.  PROCEDURES%>  PROCEDURES COMMENTS%>          Laboratory Orders  Name Status Details   BMP Completed Blood, Stat, ST - Stat, 01/05/19 22:20:00 EST, 01/05/19 22:21:00 EST, Nurse collect, DEETTE KIRSCH M, Print label Y/N   CBCDIFF Completed Blood, Stat, ST - Stat, 01/05/19 22:20:00 EST, 01/05/19 22:21:00 EST, Nurse collect, DEETTE KIRSCH HERO, Print label Y/N               Radiology Orders  No radiology orders were placed.              Patient Care Orders  Name Status Details   Discharge Patient Ordered 01/05/19 23:21:00 EST   ED Assessment Adult Completed 01/05/19 22:09:40 EST, 01/05/19 22:09:40 EST   ED Secondary Triage Completed 01/05/19 22:09:40 EST, 01/05/19 22:09:40 EST   ED Triage Adult Completed 01/05/19 21:53:07 EST, 01/05/19 21:53:07 EST   Lumbar Puncture Tray at Bedside Completed 01/05/19 22:21:00 EST, Once, 01/05/19 22:21:00 EST, position pt for LP   Saline Lock Insert Completed 01/05/19 22:20:00 EST, Once, 01/05/19 22:20:00 EST        ---------------------------------------------------------------------------------------------------------------------  [facility]) encourages you to self-enroll in the [facility] Patient Portal.  [facility] Patient Portal will allow you to manage your personal health information securely from your own electronic device now and in the future.  To begin your Patient Portal enrollment process, please visit [email or web address] Click on "Sign up now" under [facility].  If you find that you need additional assistance on the [facility] Patient Portal or need a copy of your medical records, please call the [facility] Medical Records Office at [phone].  Comment:

## 2019-01-05 NOTE — ED Provider Notes (Signed)
Vision changes        Patient:   Carrie Shields, Carrie Shields            MRN: [id number]            FIN: [id number]               Age:   23 years     Sex:  Female     DOB:  1995/10/13   Associated Diagnoses:   Idiopathic intracranial hypertension   Author:   Tommi Emery      Basic Information   Time seen: Provider Seen (ST)   ED Provider/Time:    Sherrell Puller M / 01/05/2019 22:00  .   History source: Patient.   Arrival mode: Private vehicle.   History limitation: None.   Additional information: Chief Complaint from Nursing Triage Note   Chief Complaint  Chief Complaint: pt reports intercraneal HTN and stated worsening head pain and pressue x 2 days. pt neuro is Ysidro Evert. (01/05/19 22:07:00).      History of Present Illness   The patient is a 23 year old female with a past medical history of idiopathic intracranial hypertension presented to the emergency department complaining of headache and vision changes which are consistent for when she has got increased intracranial pressure.  Patient feels that she needs a lumbar puncture.  She denies any fever or other neurologic symptoms.  Patient called her neurologist however he was out of town this week thus she came to the emergency department tonight.      Review of Systems   Constitutional symptoms:  No fever, no chills.    Eye symptoms:  Recent vision problems, blurred vision, Floaters, No diplopia,    ENMT symptoms:  No ear pain, no sore throat.    Respiratory symptoms:  No shortness of breath, no cough.    Cardiovascular symptoms:  No chest pain, no palpitations.    Gastrointestinal symptoms:  No vomiting, no diarrhea.    Genitourinary symptoms:  No dysuria, no hematuria.    Neurologic symptoms:  Headache, no numbness, no tingling, no focal weakness.              Additional review of systems information: All other systems reviewed and otherwise negative.      Health Status   Allergies:    Allergic Reactions (Selected)  Moderate  Penicillin-  Rash.  Unknown  Augmentin- Hives / itching.  Ertapenem- Hives / itching..   Medications:  (Selected)   Documented Medications  Documented  ZyrTEC 10 mg oral tablet: 10 mg, 1 tabs, Oral, Daily, PRN: as needed for allergy symptoms, 90 tabs, 0 Refill(s)  acetaZOLAMIDE 250 mg oral tablet: 750 mg, 3 tabs, Oral, Daily, 2 TABS HS, 0 Refill(s)  lamoTRIgine 100 mg oral tablet: 100 mg, 1 tabs, Oral, Daily, 0 Refill(s).      Past Medical/ Family/ Social History   Medical history: Reviewed as documented in chart.   Surgical history: Reviewed as documented in chart.   Family history: Not significant.   Social history: Reviewed as documented in chart.   Problem list:    Active Problems (5)  Anxiety and depression   Intracranial hypertension   Migraine   Seasonal allergies   Wears glasses   .      Physical Examination               Vital Signs   Vital Signs   52/84/1324 40:10 EST Systolic Blood Pressure 272 mmHg  HI  Diastolic Blood Pressure 956 mmHg  >HHI    Temperature Oral 36.8 degC    Heart Rate Monitored 88 bpm    Respiratory Rate 16 br/min    SpO2 98 %   .   Measurements   01/05/2019 22:09 EST Body Mass Index est meas 30.19 kg/m2    Body Mass Index Measured 30.19 kg/m2   01/05/2019 22:07 EST Height/Length Measured 182 cm    Weight Dosing 100 kg   .   Basic Oxygen Information   01/05/2019 22:07 EST Oxygen Therapy Room air    SpO2 98 %   .   General:  Alert, no acute distress.    Skin:  Warm, dry, pink.    Head:  Normocephalic.   Neck:  Supple, trachea midline.    Eye:  Pupils are equal, round and reactive to light, extraocular movements are intact.    Ears, nose, mouth and throat:  Oral mucosa moist.   Cardiovascular:  Regular rate and rhythm.   Respiratory:  Lungs are clear to auscultation, respirations are non-labored.    Back:  Normal range of motion.   Musculoskeletal:  Normal ROM, no tenderness.    Chest wall   Gastrointestinal:  Soft, Nontender, Non distended, Normal bowel sounds, No organomegaly.    Neurological:   Alert and oriented to person, place, time, and situation, No focal neurological deficit observed, normal sensory observed, normal motor observed.    Lymphatics:  No lymphadenopathy.   Psychiatric:  Cooperative, appropriate mood & affect.       Medical Decision Making   Differential Diagnosis:  Visual impairment, migraine headache, IIH/pseudotumor.    Documents reviewed:  Emergency department nurses' notes, flowsheet, emergency department records, prior records, vital signs.    Results review:  Lab results : Lab View   01/05/2019 23:05 EST Estimated Creatinine Clearance 167.34 mL/min   01/05/2019 22:45 EST WBC 13.2 x10e3/mcL  HI    RBC 4.51 x10e6/mcL    Hgb 12.8 g/dL    HCT 38.9 %    MCV 86.3 fL    MCH 28.4 pg    MCHC 32.9 g/dL    RDW 14.3 %    Platelet 340 x10e3/mcL    MPV 9.4 fL    Neutro Auto 71.6 %    Neutro Absolute 9.5 x10e3/mcL  HI    Immature Grans Percent 0.2 %    Immature Grans Absolute 0.02 x10e3/mcL    Lymph Auto 22.7 %    Lymph Absolute 3.0 x10e3/mcL    Mono Auto 4.5 %    Mono Absolute 0.6 x10e3/mcL    Eosinophil Percent 0.8 %    Eos Absolute 0.1 x10e3/mcL    Basophil Auto 0.2 %    Baso Absolute 0.0 x10e3/mcL    Sodium Lvl 138 mmol/L    Potassium Lvl 4.1 mmol/L    Chloride 100 mmol/L    CO2 26 mmol/L    Glucose Random 110 mg/dL  HI    BUN 13 mg/dL    Creatinine Lvl 0.7 mg/dL    AGAP 13 mmol/L    Osmolality Calc 276 mOsm/kg    Calcium Lvl 9.2 mg/dL    eGFR AA 144 mL/min/1.42m???    eGFR Non-AA 124 mL/min/1.761m??   01/05/2019 22:09 EST Estimated Creatinine Clearance 167.34 mL/min   .      Reexamination/ Reevaluation   Vital signs   Basic Oxygen Information   01/05/2019 22:07 EST Oxygen Therapy Room air    SpO2 98 %  Upon reexamination at 1119, the patient states she is feeling much better.  She had a lumbar puncture performed and has been resting for the past 20 to 30 minutes.      Procedure   Lumbar Puncture   Confirmed: Patient, procedure, and site correct.    Consent: Patient, Has given verbal  consent, Has signed consent.    Indication: Headache, Idiopathic intracranial hypertension, not possible meningitis   Pre procedure exam: Circulation, motor, and sensory intact.    Procedural sedation: None.    Monitoring: Cardiac, blood pressure, continuous pulse oximetry, See nurse's notes.    Patient position: Sitting bending forward.    Location: L4-L5.    Preparation: Sterile field established, landmarks identified, Skin prepped with betadine, Local anesthesia: 5  ml 1% lidocaine injected locally.    Technique: 20 gauge spinal needle inserted, Fluid return: clear.    Post procedure exam: Circulation, motor, sensory examination intact.    Patient tolerated: Well.    Complications: None.    Performed by: Self.    Total time: 30 minutes.    Notes: total fluid removed was 10 mL.    Notes      Impression and Plan   Diagnosis   Idiopathic intracranial hypertension (ICD10-CM G93.2, Discharge, Medical)   Plan   Condition: Stable.    Disposition: Discharged: to home.    Follow up with: PCP Within 1 week If you do not have a PCP????you may find????one????by calling our [facility] referral line at [phone] or????you may see attached clinic list.  Return to ED if symptoms worsen.  Take all your regular medications as prescribed.  Follow-up with your neurologist as scheduled..    Counseled: I had a detailed discussion with the patient and/or guardian regarding the historical points/exam findings supporting the discharge diagnosis and need for outpatient followup. Discussed the need to return to the ER if symptoms persist/worsen, or for any questions/concerns that arise at home.    Signature Line     Electronically Signed on 01/05/2019 11:21 PM EST   ________________________________________________   Tommi Emery               Modified by: Tommi Emery on 01/05/2019 11:21 PM EST

## 2019-01-05 NOTE — Discharge Summary (Signed)
 ED Clinical Summary                         [facility]  [address]  [place], [place] [address]  [phone]           PERSON INFORMATION  Name: Carrie Shields, Carrie Shields Age:  23 Years DOB: 09/11/1995   Sex: Female Language: English PCP: JEANNETTE DOUGLAS NORLEEN VICENTA   Marital Status:  Single Phone: [phone] Med Service: MED-Medicine   MRN:  [id number] Acct# [id number] Arrival: 01/05/2019 21:53:00   Visit Reason: Vision changes; BLURRED VISION Acuity: 3 LOS: 000 01:33   Address:      [address] [place] [address]  Diagnosis:      Idiopathic intracranial hypertension  Printed Prescriptions:            Allergies      ertapenem (Hives / Itching)      Augmentin (Hives / Itching)      penicillin (Rash)      Medications Administered During Visit:                  Medication Dose Route   ketorolac 15 mg IV Push   diazepam 5 mg IV Push   metoclopramide 10 mg IV Push       Patient Medication List:              acetaZOLAMIDE (acetaZOLAMIDE 250 mg oral tablet) 3 Tabs Oral (given by mouth) every day. 2 TABS HS.  cetirizine (ZyrTEC 10 mg oral tablet) 1 Tabs Oral (given by mouth) every day as needed as needed for allergy symptoms.  escitalopram (escitalopram 20 mg oral tablet) TAKE 1 TABLET BY MOUTH EVERY MORNING.  lamoTRIgine (lamoTRIgine 100 mg oral tablet) 1 Tabs Oral (given by mouth) every day.         Major Tests and Procedures:  The following procedures and tests were performed during your ED visit.  COMMONPROCEDURES%>  COMMON PROCEDURESCOMMENTS%>          Laboratory Orders  Name Status Details   BMP Completed Blood, Stat, ST - Stat, 01/05/19 22:20:00 EST, 01/05/19 22:21:00 EST, Nurse collect, DEETTE KIRSCH M, Print label Y/N   CBCDIFF Completed Blood, Stat, ST - Stat, 01/05/19 22:20:00 EST, 01/05/19 22:21:00 EST, Nurse collect, DEETTE KIRSCH HERO, Print label Y/N               Radiology Orders  No radiology orders were placed.              Patient Care  Orders  Name Status Details   Discharge Patient Ordered 01/05/19 23:21:00 EST   ED Assessment Adult Completed 01/05/19 22:09:40 EST, 01/05/19 22:09:40 EST   ED Secondary Triage Completed 01/05/19 22:09:40 EST, 01/05/19 22:09:40 EST   ED Triage Adult Completed 01/05/19 21:53:07 EST, 01/05/19 21:53:07 EST   Lumbar Puncture Tray at Bedside Completed 01/05/19 22:21:00 EST, Once, 01/05/19 22:21:00 EST, position pt for LP   Saline Lock Insert Completed 01/05/19 22:20:00 EST, Once, 01/05/19 22:20:00 EST             PROVIDER INFORMATION               Provider Role Assigned Sampson DEETTE KIRSCH [facility] ED Provider 01/05/2019 22:00:02    [place], RN, [place] F ED Nurse 01/05/2019 22:15:23        Attending Physician:  DEETTE KIRSCH HERO  Admit Doc  MARCOTTE-DO,  CLOTILDA M     Consulting Doc       VITALS INFORMATION  Vital Sign Triage Latest   Temp Oral ORAL_1%>36.8 degC ORAL%>36.8 degC   Temp Temporal TEMPORAL_1%> TEMPORAL%>   Temp Intravascular INTRAVASCULAR_1%> INTRAVASCULAR%>   Temp Axillary AXILLARY_1%> AXILLARY%>   Temp Rectal RECTAL_1%> RECTAL%>   02 Sat 98 % 98 %   Respiratory Rate RATE_1%>16 br/min RATE%>14 br/min   Peripheral Pulse Rate PULSE RATE_1%> PULSE RATE%>   Apical Heart Rate HEART RATE_1%> HEART RATE%>   Blood Pressure BLOOD PRESSURE_1%>/ BLOOD PRESSURE_1%>107 mmHg BLOOD PRESSURE%>134 mmHg / BLOOD PRESSURE%>81 mmHg                 Immunizations      No Immunizations Documented This Visit          DISCHARGE INFORMATION   Discharge Disposition: H Outpt-Sent Home   Discharge Location:    Home   Discharge Date and Time:    01/05/2019 23:26:03   ED Checkout Date and Time:    01/05/2019 23:26:03     DEPART REASON INCOMPLETE INFORMATION               Depart Action Incomplete Reason   Interactive View/I&O Recently assessed               Problems      No Problems Documented              Smoking Status      Never smoker         PATIENT EDUCATION INFORMATION  Instructions:       Pseudotumor Cerebri;  Idiopathic Intracranial Hypertension     Follow up:                    With: Address: When:   PCP  Within 1 week   Comments:   If you do not have a PCP you may find one by calling our [facility] referral line at [phone] or you may see attached clinic list.   Return to ED if symptoms worsen. Take all your regular medications as prescribed. Follow-up with your neurologist as scheduled.           ED PROVIDER DOCUMENTATION     Patient:   Carrie Shields, Carrie Shields            MRN: [id number]            FIN: [id number]               Age:   23 years     Sex:  Female     DOB:  05-11-1995   Associated Diagnoses:   Idiopathic intracranial hypertension   Author:   DEETTE CLOTILDA HERO      Basic Information   Time seen: Provider Seen (ST)   ED Provider/Time:    DEETTE CLOTILDA M / 01/05/2019 22:00  .   History source: Patient.   Arrival mode: Private vehicle.   History limitation: None.   Additional information: Chief Complaint from Nursing Triage Note   Chief Complaint  Chief Complaint: pt reports intercraneal HTN and stated worsening head pain and pressue x 2 days. pt neuro is [place]. (01/05/19 22:07:00).      History of Present Illness   The patient is a 23 year old female with a past medical history of idiopathic intracranial hypertension presented to the emergency department complaining of headache and vision changes which are consistent for when she has  got increased intracranial pressure.  Patient feels that she needs a lumbar puncture.  She denies any fever or other neurologic symptoms.  Patient called her neurologist however he was out of town this week thus she came to the emergency department tonight.      Review of Systems   Constitutional symptoms:  No fever, no chills.    Eye symptoms:  Recent vision problems, blurred vision, Floaters, No diplopia,    ENMT symptoms:  No ear pain, no sore throat.    Respiratory symptoms:  No shortness of breath, no cough.    Cardiovascular symptoms:  No chest pain, no palpitations.     Gastrointestinal symptoms:  No vomiting, no diarrhea.    Genitourinary symptoms:  No dysuria, no hematuria.    Neurologic symptoms:  Headache, no numbness, no tingling, no focal weakness.              Additional review of systems information: All other systems reviewed and otherwise negative.      Health Status   Allergies:    Allergic Reactions (Selected)  Moderate  Penicillin- Rash.  Unknown  Augmentin- Hives / itching.  Ertapenem- Hives / itching..   Medications:  (Selected)   Documented Medications  Documented  ZyrTEC 10 mg oral tablet: 10 mg, 1 tabs, Oral, Daily, PRN: as needed for allergy symptoms, 90 tabs, 0 Refill(s)  acetaZOLAMIDE 250 mg oral tablet: 750 mg, 3 tabs, Oral, Daily, 2 TABS HS, 0 Refill(s)  lamoTRIgine 100 mg oral tablet: 100 mg, 1 tabs, Oral, Daily, 0 Refill(s).      Past Medical/ Family/ Social History   Medical history: Reviewed as documented in chart.   Surgical history: Reviewed as documented in chart.   Family history: Not significant.   Social history: Reviewed as documented in chart.   Problem list:    Active Problems (5)  Anxiety and depression   Intracranial hypertension   Migraine   Seasonal allergies   Wears glasses   .      Physical Examination               Vital Signs   Vital Signs   01/05/2019 22:07 EST Systolic Blood Pressure 150 mmHg  HI    Diastolic Blood Pressure 107 mmHg  >HHI    Temperature Oral 36.8 degC    Heart Rate Monitored 88 bpm    Respiratory Rate 16 br/min    SpO2 98 %   .   Measurements   01/05/2019 22:09 EST Body Mass Index est meas 30.19 kg/m2    Body Mass Index Measured 30.19 kg/m2   01/05/2019 22:07 EST Height/Length Measured 182 cm    Weight Dosing 100 kg   .   Basic Oxygen Information   01/05/2019 22:07 EST Oxygen Therapy Room air    SpO2 98 %   .   General:  Alert, no acute distress.    Skin:  Warm, dry, pink.    Head:  Normocephalic.   Neck:  Supple, trachea midline.    Eye:  Pupils are equal, round and reactive to light, extraocular movements are  intact.    Ears, nose, mouth and throat:  Oral mucosa moist.   Cardiovascular:  Regular rate and rhythm.   Respiratory:  Lungs are clear to auscultation, respirations are non-labored.    Back:  Normal range of motion.   Musculoskeletal:  Normal ROM, no tenderness.    Chest wall   Gastrointestinal:  Soft, Nontender, Non distended, Normal bowel sounds, No  organomegaly.    Neurological:  Alert and oriented to person, place, time, and situation, No focal neurological deficit observed, normal sensory observed, normal motor observed.    Lymphatics:  No lymphadenopathy.   Psychiatric:  Cooperative, appropriate mood & affect.       Medical Decision Making   Differential Diagnosis:  Visual impairment, migraine headache, IIH/pseudotumor.    Documents reviewed:  Emergency department nurses' notes, flowsheet, emergency department records, prior records, vital signs.    Results review:  Lab results : Lab View   01/05/2019 23:05 EST Estimated Creatinine Clearance 167.34 mL/min   01/05/2019 22:45 EST WBC 13.2 x10e3/mcL  HI    RBC 4.51 x10e6/mcL    Hgb 12.8 g/dL    HCT 61.0 %    MCV 13.6 fL    MCH 28.4 pg    MCHC 32.9 g/dL    RDW 85.6 %    Platelet 340 x10e3/mcL    MPV 9.4 fL    Neutro Auto 71.6 %    Neutro Absolute 9.5 x10e3/mcL  HI    Immature Grans Percent 0.2 %    Immature Grans Absolute 0.02 x10e3/mcL    Lymph Auto 22.7 %    Lymph Absolute 3.0 x10e3/mcL    Mono Auto 4.5 %    Mono Absolute 0.6 x10e3/mcL    Eosinophil Percent 0.8 %    Eos Absolute 0.1 x10e3/mcL    Basophil Auto 0.2 %    Baso Absolute 0.0 x10e3/mcL    Sodium Lvl 138 mmol/L    Potassium Lvl 4.1 mmol/L    Chloride 100 mmol/L    CO2 26 mmol/L    Glucose Random 110 mg/dL  HI    BUN 13 mg/dL    Creatinine Lvl 0.7 mg/dL    AGAP 13 mmol/L    Osmolality Calc 276 mOsm/kg    Calcium Lvl 9.2 mg/dL    eGFR AA 855 fO/fpw/8.26f    eGFR Non-AA 124 mL/min/1.77m   01/05/2019 22:09 EST Estimated Creatinine Clearance 167.34 mL/min   .      Reexamination/ Reevaluation   Vital  signs   Basic Oxygen Information   01/05/2019 22:07 EST Oxygen Therapy Room air    SpO2 98 %      Upon reexamination at 1119, the patient states she is feeling much better.  She had a lumbar puncture performed and has been resting for the past 20 to 30 minutes.      Procedure   Lumbar Puncture   Confirmed: Patient, procedure, and site correct.    Consent: Patient, Has given verbal consent, Has signed consent.    Indication: Headache, Idiopathic intracranial hypertension, not possible meningitis   Pre procedure exam: Circulation, motor, and sensory intact.    Procedural sedation: None.    Monitoring: Cardiac, blood pressure, continuous pulse oximetry, See nurse's notes.    Patient position: Sitting bending forward.    Location: L4-L5.    Preparation: Sterile field established, landmarks identified, Skin prepped with betadine, Local anesthesia: 5  ml 1% lidocaine injected locally.    Technique: 20 gauge spinal needle inserted, Fluid return: clear.    Post procedure exam: Circulation, motor, sensory examination intact.    Patient tolerated: Well.    Complications: None.    Performed by: Self.    Total time: 30 minutes.    Notes: total fluid removed was 10 mL.    Notes      Impression and Plan   Diagnosis   Idiopathic intracranial hypertension (ICD10-CM G93.2, Discharge, Medical)  Plan   Condition: Stable.    Disposition: Discharged: to home.    Follow up with: PCP Within 1 week If you do not have a PCPyou may findoneby calling our [facility] referral line at [phone] oryou may see attached clinic list.  Return to ED if symptoms worsen.  Take all your regular medications as prescribed.  Follow-up with your neurologist as scheduled..    Counseled: I had a detailed discussion with the patient and/or guardian regarding the historical points/exam findings supporting the discharge diagnosis and need for outpatient followup. Discussed the need to return to the ER if symptoms persist/worsen, or for any  questions/concerns that arise at home.

## 2019-01-05 NOTE — ED Notes (Signed)
ED Note-Nursing       ED RN Reassessment Entered On:  01/05/2019 23:10 EST    Performed On:  01/05/2019 23:00 EST by Clinton Sawyer, RN, Archie Endo               ED RN Reassessment   ED Patient condition :   Alert, Feeling slightly better   ED Behavioral Activity Rating Scale :   4 - Quiet and awake (normal level of activity)   ED RN Progress Note :   Patient tolerated LP well. Patient lying flat on bed with lights dimmed. Patient states no needs at this time.   Call bell within reach for any needs of patient.    [place]rio, RN, [employer] F - 01/05/2019 23:09 EST

## 2019-01-05 NOTE — ED Notes (Signed)
ED Triage Note       ED Triage Adult Entered On:  01/05/2019 22:09 EST    Performed On:  01/05/2019 22:07 EST by Barrington Ellison, RN, Irving Burton A               Triage   Chief Complaint :   pt reports intercraneal HTN and stated worsening head pain and pressue x 2 days. pt neuro is Kizzie Bane.   Numeric Rating Pain Scale :   7   [place] Mode of Arrival :   Private vehicle   Infectious Disease Documentation :   Document assessment   Temperature Oral :   36.8 degC(Converted to: 98.2 degF)    Heart Rate Monitored :   88 bpm   Respiratory Rate :   16 br/min   Systolic Blood Pressure :   150 mmHg (HI)    Diastolic Blood Pressure :   107 mmHg (>HHI)    SpO2 :   98 %   Oxygen Therapy :   Room air   Patient presentation :   None of the above   Chief Complaint or Presentation suggest infection :   No   Weight Dosing :   100 kg(Converted to: 220 lb 7 oz)    Height :   182 cm(Converted to: 6 ft 0 in)    Body Mass Index Dosing :   30 kg/m2   Strong, RN, Lyman Speller - 01/05/2019 22:07 EST   DCP GENERIC CODE   Tracking Acuity :   3   Tracking Group :   ED [employer]SF [employer], RN, Lyman Speller - 01/05/2019 22:07 EST   ED General Section :   Document assessment   Pregnancy Status :   Patient denies   ED Allergies Section :   Document assessment   ED Reason for Visit Section :   Document assessment   Strong, RN, Lyman Speller - 01/05/2019 22:07 EST   ID Risk Screen Symptoms   Recent Travel History :   No recent travel   Close Contact with COVID-19 ID :   No   Last 14 days COVID-19 ID :   No   TB Symptom Screen :   No symptoms   C. diff Symptom/History ID :   Neither of the above   Strong, RN, Irving Burton A - 01/05/2019 22:07 EST   Allergies   (As Of: 01/05/2019 22:09:39 EST)   Allergies (Active)   Augmentin  Estimated Onset Date:   Unspecified ; Reactions:   Hives / Itching ; Created By:   Lady Gary, RN, [name]; Reaction Status:   Active ; Category:   Drug ; Substance:   Augmentin ; Type:   Allergy ; Severity:   Unknown ; Updated By:   Lady Gary, RN, Charm Barges;  Reviewed Date:   01/05/2019 22:09 EST      ertapenem  Estimated Onset Date:   Unspecified ; Reactions:   Hives / Itching ; Created By:   Lady Gary, RN, [name]; Reaction Status:   Active ; Category:   Drug ; Substance:   ertapenem ; Type:   Allergy ; Severity:   Unknown ; Updated By:   Lady Gary RN, Charm Barges; Reviewed Date:   01/05/2019 22:09 EST      penicillin  Estimated Onset Date:   Unspecified ; Reactions:   Rash ; Created By:   Jorge Mandril, RN, Vista Lawman; Reaction Status:   Active ; Category:   Drug ; Substance:  penicillin ; Type:   Allergy ; Severity:   Moderate ; Updated By:   Kirk Ruths, RN, Ross Ludwig; Reviewed Date:   01/05/2019 22:09 EST        Psycho-Social   Last 3 mo, thoughts killing self/others :   Patient denies   Feels Unsafe at Home :   No   Strong, RN, Rosey Bath - 01/05/2019 22:07 EST   ED Reason for Visit   (As Of: 01/05/2019 22:09:39 EST)   Problems(Active)    Anxiety and depression (SNOMED CT  :[id number] )  Name of Problem:   Anxiety and depression ; Recorder:   Celine Ahr, RN, [name]; Confirmation:   Confirmed ; Classification:   Patient Stated ; Code:   [id number] ; Contributor System:   PowerChart ; Last Updated:   10/09/2017 9:08 EDT ; Life Cycle Date:   10/09/2017 ; Life Cycle Status:   Active ; Vocabulary:   SNOMED CT        Intracranial hypertension (SNOMED CT  :431540086 )  Name of Problem:   Intracranial hypertension ; Recorder:   LUTZ, RN, [name]; Confirmation:   Confirmed ; Classification:   Patient Stated ; Code:   [id number] ; Contributor System:   Conservation officer, nature ; Last Updated:   06/29/2018 15:20 EDT ; Life Cycle Date:   06/29/2018 ; Life Cycle Status:   Active ; Vocabulary:   SNOMED CT        Migraine (SNOMED CT  :67124580 )  Name of Problem:   Migraine ; Recorder:   Belenda Cruise, RN, Ross Ludwig; Confirmation:   Confirmed ; Classification:   Patient Stated ; Code:   [id number] ; Contributor System:   PowerChart ; Last Updated:   06/14/2018 14:33 EDT ; Life Cycle Date:   06/14/2018 ; Life Cycle Status:   Active ;  Vocabulary:   SNOMED CT        Seasonal allergies (SNOMED CT  :[phone] )  Name of Problem:   Seasonal allergies ; Recorder:   MCBRIDE, RN, ERIN R; Confirmation:   Confirmed ; Classification:   Patient Stated ; Code:   [phone] ; Contributor System:   PowerChart ; Last Updated:   01/19/2015 9:39 EST ; Life Cycle Date:   01/19/2015 ; Life Cycle Status:   Active ; Vocabulary:   SNOMED CT        Wears glasses (SNOMED CT  :497026378 )  Name of Problem:   Wears glasses ; Recorder:   LUTZ, RN, [name]; Confirmation:   Confirmed ; Classification:   Patient Stated ; Code:   [id number] ; Contributor System:   Conservation officer, nature ; Last Updated:   06/29/2018 15:20 EDT ; Life Cycle Date:   06/29/2018 ; Life Cycle Status:   Active ; Vocabulary:   SNOMED CT          Diagnoses(Active)    Vision changes  Date:   01/05/2019 ; Diagnosis Type:   Reason For Visit ; Confirmation:   Complaint of ; Clinical Dx:   Vision changes ; Classification:   Medical ; Clinical Service:   Emergency medicine ; Code:   PNED ; Probability:   0 ; Diagnosis Code:   [id number]

## 2019-01-05 NOTE — ED Notes (Signed)
ED Triage Note       ED Secondary Triage Entered On:  01/05/2019 22:17 EST    Performed On:  01/05/2019 22:16 EST by Clinton Sawyer, RN, Candice F               General Information   Barriers to Learning :   None evident   ED Home Meds Section :   Document assessment   [facility] ED Fall Risk Section :   Document assessment   ED History Section :   Document assessment   ED Advance Directives Section :   Document assessment   ED Palliative Screen :   N/A (prefilled for <65yo)   Scrio, RN, Candice F - 01/05/2019 22:16 EST   (As Of: 01/05/2019 22:17:09 EST)   Problems(Active)    Anxiety and depression (SNOMED CT  :45809983 )  Name of Problem:   Anxiety and depression ; Recorder:   Lady Gary, RN, [name]; Confirmation:   Confirmed ; Classification:   Patient Stated ; Code:   [id number] ; Contributor System:   PowerChart ; Last Updated:   10/09/2017 9:08 EDT ; Life Cycle Date:   10/09/2017 ; Life Cycle Status:   Active ; Vocabulary:   SNOMED CT        Intracranial hypertension (SNOMED CT  :767341937 )  Name of Problem:   Intracranial hypertension ; Recorder:   LUTZ, RN, [name]; Confirmation:   Confirmed ; Classification:   Patient Stated ; Code:   [id number] ; Contributor System:   Dietitian ; Last Updated:   06/29/2018 15:20 EDT ; Life Cycle Date:   06/29/2018 ; Life Cycle Status:   Active ; Vocabulary:   SNOMED CT        Migraine (SNOMED CT  :32992426 )  Name of Problem:   Migraine ; Recorder:   Earl Lites, RN, Vista Lawman; Confirmation:   Confirmed ; Classification:   Patient Stated ; Code:   [id number] ; Contributor System:   PowerChart ; Last Updated:   06/14/2018 14:33 EDT ; Life Cycle Date:   06/14/2018 ; Life Cycle Status:   Active ; Vocabulary:   SNOMED CT        Seasonal allergies (SNOMED CT  :[phone] )  Name of Problem:   Seasonal allergies ; Recorder:   MCBRIDE, RN, ERIN R; Confirmation:   Confirmed ; Classification:   Patient Stated ; Code:   [phone] ; Contributor System:    PowerChart ; Last Updated:   01/19/2015 9:39 EST ; Life Cycle Date:   01/19/2015 ; Life Cycle Status:   Active ; Vocabulary:   SNOMED CT        Wears glasses (SNOMED CT  :662947654 )  Name of Problem:   Wears glasses ; Recorder:   LUTZ, RN, [name]; Confirmation:   Confirmed ; Classification:   Patient Stated ; Code:   [id number] ; Contributor System:   Dietitian ; Last Updated:   06/29/2018 15:20 EDT ; Life Cycle Date:   06/29/2018 ; Life Cycle Status:   Active ; Vocabulary:   SNOMED CT          Diagnoses(Active)    Vision changes  Date:   01/05/2019 ; Diagnosis Type:   Reason For Visit ; Confirmation:   Complaint of ; Clinical Dx:   Vision changes ; Classification:   Medical ; Clinical Service:   Emergency medicine ; Code:   PNED ; Probability:   0 ; Diagnosis Code:   [id number]             -  Procedure History   (As Of: 01/05/2019 22:17:09 EST)     Procedure Dt/Tm:   01/22/2014 ; Anesthesia Minutes:   0 ; Procedure Name:   Extraction of wisdom tooth ; Procedure Minutes:   0            Procedure Dt/Tm:   01/13/2013 ; Anesthesia Minutes:   0 ; Procedure Name:   Arthroscopy ; Procedure Minutes:   0 ; Comments:     01/19/2015 9:40 EST - MCBRIDE, RN, ERIN R  RIGHT KNEE            Procedure Dt/Tm:   01/19/2015 13:19:00 EST ; Location:   [facility] ; Provider:   LAMB-MD,  JOSHUA H; Anesthesia Type:   General ; :   FERLA-MD,  BRIAN P; Anesthesia Minutes:   0 ; Procedure Name:   Ankle Achilles Tendon Repair (Ankle, Left) ; Procedure Minutes:   101 ; Comments:     01/19/2015 14:45 EST - ZEIF, RN, REBECCA N  auto-populated from documented surgical case ; Clinical Service:   Surgery            Procedure Dt/Tm:   02/12/2015 16:52:00 EST ; Location:   [facility] ; Provider:   LAMB-MD,  JOSHUA H; Anesthesia Type:   General ; :   FERLA-MD,  BRIAN P; Anesthesia Minutes:   0 ; Procedure Name:   Ankle Achilles Tendon Repair (Left) ; Procedure Minutes:   79 ; Comments:     02/12/2015 18:26 EST - Rock Nephew, RN, RON D  auto-populated from  documented surgical case ; Clinical Service:   Surgery            Procedure Dt/Tm:   02/12/2015 16:52:00 EST ; Location:   [facility] ; Provider:   LAMB-MD,  JOSHUA H; Anesthesia Type:   General ; :   FERLA-MD,  BRIAN P; Anesthesia Minutes:   0 ; Procedure Name:   Ankle and/or Foot Incision and Drainage (Left) ; Procedure Minutes:   79 ; Comments:     02/12/2015 18:26 EST - Rock Nephew, RN, RON D  auto-populated from documented surgical case ; Clinical Service:   Surgery            Procedure Dt/Tm:   03/04/2015 13:36:00 EST ; Location:   [facility] ; Provider:   Leighton Ruff; Anesthesia Type:   General ; :   Thomes Lolling; Anesthesia Minutes:   0 ; Procedure Name:   Ankle and/or Foot Incision and Drainage ; Procedure Minutes:   1502 ; Comments:     03/04/2015 15:04 EST - DANIEL, RN, MELISSA S  auto-populated from documented surgical case ; Clinical Service:   Surgery            Procedure Dt/Tm:   07/26/2017 15:26:00 EDT ; Location:   [facility] ; Provider:   Blair Hailey; Anesthesia Type:   General ; :   PENDARVIS-MD, JR, ALLEN E; Anesthesia Minutes:   0 ; Procedure Name:   Ankle Achilles Tendon Repair (Right) ; Procedure Minutes:   31 ; Comments:     05/18/3974 73:41 EDT - Loma Boston, RN, Elizabeth  auto-populated from documented surgical case ; Clinical Service:   Surgery            Procedure Dt/Tm:   10/13/2017 12:32:00 EDT ; Location:   [facility] ; Provider:   Blair Hailey; Anesthesia Type:   General ; :   CHOI-MD,  YOUNG; Anesthesia Minutes:  0 ; Procedure Name:   Ankle and/or Foot Incision and Drainage (Right) ; Procedure Minutes:   20 ; Comments:     10/13/2017 13:00 EDT - ABELS, RN, PENNE L  auto-populated from documented surgical case ; Clinical Service:   Surgery            Anesthesia Minutes:   0 ; Procedure Name:   LUMBAR PUNCTURE ; Procedure Minutes:   0            [facility] Fall Risk Assessment Tool   Hx of falling last 3 months ED Fall :   No   Patient confused or disoriented ED Fall :   No   Patient intoxicated  or sedated ED Fall :   No   Patient impaired gait ED Fall :   No   Use a mobility assistance device ED Fall :   No   Patient altered elimination ED Fall :   No   [facility] ED Fall Score :   0    Scrio, RN, Rolling[place]andice F - 01/05/2019 22:16 EST   ED Advance Directive   Advance Directive :   No   Scrio, RN, Arts development officerCandice F - 01/05/2019 22:16 EST   Social History   Social History   (As Of: 01/05/2019 22:17:09 EST)   Tobacco:        Never smoker   (Last Updated: 02/10/2015 16:37:45 EST by Colon BranchARSON, RN, MELISSA)          Alcohol:        Denies   (Last Updated: 01/19/2015 09:41:36 EST by Adin HectorMCBRIDE, RN, ERIN R)          Substance Use:        Denies   (Last Updated: 07/26/2017 12:21:43 EDT by Elijah BirkALDWELL, RN, TARA J)            Med Hx   Medication List   (As Of: 01/05/2019 22:17:09 EST)   Home Meds    escitalopram  :   escitalopram ; Status:   Documented ; Ordered As Mnemonic:   escitalopram 20 mg oral tablet ; Simple Display Line:   TAKE 1 TABLET BY MOUTH EVERY MORNING ; Catalog Code:   escitalopram ; Order Dt/Tm:   01/05/2019 22:16:49 EST          acetaZOLAMIDE  :   acetaZOLAMIDE ; Status:   Documented ; Ordered As Mnemonic:   acetaZOLAMIDE 250 mg oral tablet ; Simple Display Line:   750 mg, 3 tabs, Oral, Daily, 2 TABS HS, 0 Refill(s) ; Catalog Code:   acetaZOLAMIDE ; Order Dt/Tm:   10/06/2018 14:24:51 EDT          lamoTRIgine  :   lamoTRIgine ; Status:   Documented ; Ordered As Mnemonic:   lamoTRIgine 100 mg oral tablet ; Simple Display Line:   100 mg, 1 tabs, Oral, Daily, 0 Refill(s) ; Catalog Code:   lamoTRIgine ; Order Dt/Tm:   10/06/2018 14:23:52 EDT          cetirizine  :   cetirizine ; Status:   Documented ; Ordered As Mnemonic:   ZyrTEC 10 mg oral tablet ; Simple Display Line:   10 mg, 1 tabs, Oral, Daily, PRN: as needed for allergy symptoms, 90 tabs, 0 Refill(s) ; Catalog Code:   cetirizine ; Order Dt/Tm:   03/03/2015 11:01:07 EST

## 2019-01-05 NOTE — ED Notes (Signed)
ED Patient Education Note     Patient Education Materials Follows:  Neurology     Pseudotumor Cerebri    Pseudotumor cerebri, also called idiopathic intracranial hypertension, is a condition that occurs due to increased pressure within your skull. ?Although some of the symptoms resemble those of a brain tumor, it is not a brain tumor. Symptoms occur when the increased pressure in your skull compresses brain structures. For example, pressure on the nerve responsible for vision (optic nerve) causes it to swell, resulting in visual symptoms.    Pseudotumor cerebri tends to occur in obese women younger than 21 years of age. However, men and children can also develop this condition.    SYMPTOMS    Symptoms of pseudotumor cerebri occur due to increased pressure within the skull. Symptoms may include:      Headaches.     Nausea and vomiting.      Dizziness.     High blood pressure. ?     Ringing in the ears.     Double or blurred vision.     Brief episodes of complete loss of vision.      Pain in the back, neck, or shoulders.    DIAGNOSIS    Pseudotumor cerebri is diagnosed through:     A detailed eye exam, which can reveal a swollen optic nerve, as well as identifying issues such as blind spots in the vision.      An MRI or CT scan to rule out other disorders that can cause similar symptoms, such as brain tumors.     A spinal tap (lumbar puncture), which can demonstrate increased pressure within the skull.    TREATMENT    There are several ways that pseudotumor cerebri is treated, including:     Medicines to decrease the production of spinal fluid and lower the pressure within your skull.     Medicines to prevent or treat headaches.      Surgery to create an opening in your optic nerve to allow excess fluid to drain out.     Surgery to place drains (shunts) in your brain to remove excess fluid.    HOME CARE INSTRUCTIONS     Take all medicines as directed by your health care provider.     Go to all of your follow-up  appointments.      Lose weight if you are overweight.    SEEK MEDICAL CARE IF:     Any symptoms come back.      You develop trouble with hearing, vision, balance, or your sense of smell.      You cannot eat or drink what you need.     You are more weak or tired than usual. ?     You are losing weight without trying.    SEEK IMMEDIATE MEDICAL CARE IF:     You have new symptoms such as vision problems or difficulty walking. ?     You have a seizure. ?     You have trouble breathing. ?     You have a fever.     ?    This information is not intended to replace advice given to you by your health care provider. Make sure you discuss any questions you have with your health care provider.    Document Released: 01/04/2013 Document Reviewed: 01/04/2013  Elsevier Interactive Patient Education ?2016 [employer]      Ophthalmology     Idiopathic Intracranial Hypertension    Idiopathic intracranial  hypertension (IIH) is a neurologic disorder that leads to increased pressure around your brain. It can cause vision loss and blindness if left untreated.    RISK FACTORS    IIH is most common in very overweight (obese) women of childbearing age.    SIGNS AND SYMPTOMS    Symptoms of IIH include:     Headache.     Feeling of sickness in your stomach (nausea).     Vomiting.     A "rushing of water" sound within your ears (pulsatile tinnitus).      Double vision.     DIAGNOSIS    Idiopathic intracranial hypertension is diagnosed with the aid of different exams:     Brain scans such as:    ? CT.    ? MRI.     ? MRV.     Diagnostic lumbar puncture. This procedure can determine if there is too much spinal fluid within the central nervous system. Too much spinal fluid can increase intracranial pressure.      A thorough eye exam will be done to look for swelling within the eyes. Visual field testing will also be done to see if any damage has occurred to nerves in the eyes.     TREATMENT    Treatment of idiopathic intracranial hypertension is  based on symptoms. Common treatments include:     Lumbar puncture to remove excess spinal fluid.      Medicine.      Surgery.     HOME CARE INSTRUCTIONS    The most important thing anyone can do to improve this condition is lose weight if they are overweight.     SEEK MEDICAL CARE IF:     You have changes in vision.     You have double vision.     You have loss of color vision.    SEEK IMMEDIATE MEDICAL CARE IF:     Your headaches get worse rather than better.      Nausea or vomiting or both continue after treatment.      Your vision does not improve or gets worse after treatment.     MAKE SURE YOU:     Understand these instructions.     Will watch your condition.     Will get help right away if you are not doing well or get worse.    This information is not intended to replace advice given to you by your health care provider. Make sure you discuss any questions you have with your health care provider.    Document Released: 03/10/2001 Document Revised: 01/04/2013 Document Reviewed: 09/06/2012  Elsevier Interactive Patient Education ?2016 [employer]

## 2019-01-06 LAB — CBC WITH AUTO DIFFERENTIAL
Absolute Baso #: 0 10*3/uL (ref 0.0–0.2)
Absolute Eos #: 0.1 10*3/uL (ref 0.0–0.5)
Absolute Lymph #: 3 10*3/uL (ref 1.0–3.2)
Absolute Mono #: 0.6 10*3/uL (ref 0.3–1.0)
Basophils %: 0.2 % (ref 0.0–2.0)
Eosinophils %: 0.8 % (ref 0.0–7.0)
Hematocrit: 38.9 % (ref 34.0–47.0)
Hemoglobin: 12.8 g/dL (ref 11.5–15.7)
Immature Grans (Abs): 0.02 10*3/uL (ref 0.00–0.06)
Immature Granulocytes: 0.2 % (ref 0.1–0.6)
Lymphocytes: 22.7 % (ref 15.0–45.0)
MCH: 28.4 pg (ref 27.0–34.5)
MCHC: 32.9 g/dL (ref 32.0–36.0)
MCV: 86.3 fL (ref 81.0–99.0)
MPV: 9.4 fL (ref 7.2–13.2)
Monocytes: 4.5 % (ref 4.0–12.0)
Neutrophils %: 71.6 % (ref 42.0–74.0)
Neutrophils Absolute: 9.5 10*3/uL — ABNORMAL HIGH (ref 1.6–7.3)
Platelets: 340 10*3/uL (ref 140–440)
RBC: 4.51 x10e6/mcL (ref 3.60–5.20)
RDW: 14.3 % (ref 11.0–16.0)
WBC: 13.2 10*3/uL — ABNORMAL HIGH (ref 3.8–10.6)

## 2019-01-06 LAB — BASIC METABOLIC PANEL
Anion Gap: 13 mmol/L (ref 2–17)
BUN: 13 mg/dL (ref 6–20)
CO2: 26 mmol/L (ref 22–29)
Calcium: 9.2 mg/dL (ref 8.6–10.0)
Chloride: 100 mmol/L (ref 98–107)
Creatinine: 0.7 mg/dL (ref 0.5–0.9)
GFR African American: 144 mL/min/{1.73_m2} (ref >=90)
GFR Non-African American: 124 mL/min/{1.73_m2} (ref >=90)
Glucose: 110 mg/dL — ABNORMAL HIGH (ref 70–99)
OSMOLALITY CALCULATED: 276 mOsm/kg (ref 270–287)
Potassium: 4.1 mmol/L (ref 3.5–5.3)
Sodium: 138 mmol/L (ref 135–145)

## 2019-03-24 NOTE — Nursing Note (Signed)
Adult Admission Assessment - Text       Perioperative Admission Assessment Entered On:  03/24/2019 9:03 EST    Performed On:  03/24/2019 9:02 EST by Ashok Cordia, RN, CATHERINE               General   Call Start :   03/24/2019 11:55 EST   Call Complete :   03/24/2019 12:04 EST   Height/Length Estimated :   183 cm(Converted to: 6 ft 0 in, 6.00 ft, 72.05 in)    BMI   Estimated :   121 kg(Converted to: 266 lb 12 oz, 266.759 lb)    Body Mass Index Estimated :   36.13 kg/m2   Languages :   [place]   Preferred Communication Mode :   Verbal   Information Given By :   Estrella Deeds, RN, CATHERINE - 03/24/2019 11:54 EST     Primary Care Physician/Specialists :   DR. Cyril Mourning, PCP  DR. Maisie Fus [facility]   Emergency Contact Name :   [name]- MOTHER   Emergency Contact Phone :   [phone], CELL   [place], RN, CATHERINE - 03/24/2019 9:02 EST   Allergies   (As Of: 03/28/2019 09:06:54 EDT)   Allergies (Active)   Augmentin  Estimated Onset Date:   Unspecified ; Reactions:   Hives / Itching ; Created By:   Lady Gary, RN, [name]; Reaction Status:   Active ; Category:   Drug ; Substance:   Augmentin ; Type:   Allergy ; Severity:   Unknown ; Updated By:   Lady Gary, RN, Charm Barges; Reviewed Date:   03/28/2019 9:04 EDT      ertapenem  Estimated Onset Date:   Unspecified ; Reactions:   Hives / Itching ; Created By:   Lady Gary, RN, [name]; Reaction Status:   Active ; Category:   Drug ; Substance:   ertapenem ; Type:   Allergy ; Severity:   Unknown ; Updated By:   Lady Gary RN, Charm Barges; Reviewed Date:   03/28/2019 9:04 EDT      penicillin  Estimated Onset Date:   Unspecified ; Reactions:   Rash ; Created By:   Jorge Mandril, RN, [name]; Reaction Status:   Active ; Category:   Drug ; Substance:   penicillin ; Type:   Allergy ; Severity:   Moderate ; Updated By:   Jorge Mandril, RN, Vista Lawman; Reviewed Date:   03/28/2019 9:04 EDT        Medication History   Medication List   (As Of: 03/28/2019 09:09:38 EDT)   Home Meds    diazepam  :   diazepam ;  Status:   Documented ; Ordered As Mnemonic:   Valium 10 mg oral tablet ; Simple Display Line:   10 mg, 1 tabs, Oral, On Call, pre-procedures, PRN: other (see comment), 0 Refill(s) ; Catalog Code:   diazepam ; Order Dt/Tm:   03/28/2019 09:08:51 EDT          acetaZOLAMIDE  :   acetaZOLAMIDE ; Status:   Documented ; Ordered As Mnemonic:   acetaZOLAMIDE 250 mg oral tablet ; Simple Display Line:   750 mg, 3 tabs, Oral, qPM, 0 Refill(s) ; Catalog Code:   acetaZOLAMIDE ; Order Dt/Tm:   03/24/2019 11:59:17 EST          FLUoxetine  :   FLUoxetine ; Status:   Documented ; Ordered As Mnemonic:   FLUoxetine 20 mg oral capsule ; Simple  Display Line:   40 mg, 2 caps, Oral, Daily, 0 Refill(s) ; Catalog Code:   FLUoxetine ; Order Dt/Tm:   03/24/2019 11:59:17 EST          lamoTRIgine  :   lamoTRIgine ; Status:   Documented ; Ordered As Mnemonic:   lamoTRIgine 150 mg oral tablet ; Simple Display Line:   150 mg, 1 tabs, Oral, Daily, 0 Refill(s) ; Catalog Code:   lamoTRIgine ; Order Dt/Tm:   03/24/2019 11:59:17 EST          norethindrone-ethinyl estradiol  :   norethindrone-ethinyl estradiol ; Status:   Documented ; Ordered As Mnemonic:   Junel Fe 1/20 oral tablet ; Simple Display Line:   1 tabs, Oral, qAM, 0 Refill(s) ; Catalog Code:   norethindrone-ethinyl estradiol ; Order Dt/Tm:   03/24/2019 11:59:17 EST          acetaZOLAMIDE  :   acetaZOLAMIDE ; Status:   Documented ; Ordered As Mnemonic:   acetaZOLAMIDE 250 mg oral tablet ; Simple Display Line:   500 mg, 2 tabs, Oral, qAM, 2 TABS HS, 0 Refill(s) ; Catalog Code:   acetaZOLAMIDE ; Order Dt/Tm:   10/06/2018 14:24:51 EDT            Problem History   (As Of: 03/28/2019 09:06:54 EDT)   Problems(Active)    Anxiety and depression (SNOMED CT  :[id number] )  Name of Problem:   Anxiety and depression ; Recorder:   Lady Gary, RN, [name]; Confirmation:   Confirmed ; Classification:   Patient Stated ; Code:   [id number] ; Contributor System:   PowerChart ; Last Updated:   10/09/2017 9:08 EDT ; Life Cycle  Date:   10/09/2017 ; Life Cycle Status:   Active ; Vocabulary:   SNOMED CT        Intracranial hypertension (SNOMED CT  :258527782 )  Name of Problem:   Intracranial hypertension ; Recorder:   LUTZ, RN, [name]; Confirmation:   Confirmed ; Classification:   Patient Stated ; Code:   [id number] ; Contributor System:   Dietitian ; Last Updated:   06/29/2018 15:20 EDT ; Life Cycle Date:   06/29/2018 ; Life Cycle Status:   Active ; Vocabulary:   SNOMED CT        Migraine (SNOMED CT  :31540086 )  Name of Problem:   Migraine ; Recorder:   Earl Lites, RN, Vista Lawman; Confirmation:   Confirmed ; Classification:   Patient Stated ; Code:   76195093 ; Contributor System:   PowerChart ; Last Updated:   06/14/2018 14:33 EDT ; Life Cycle Date:   06/14/2018 ; Life Cycle Status:   Active ; Vocabulary:   SNOMED CT        Seasonal allergies (SNOMED CT  :[phone] )  Name of Problem:   Seasonal allergies ; Recorder:   MCBRIDE, RN, ERIN R; Confirmation:   Confirmed ; Classification:   Patient Stated ; Code:   [phone] ; Contributor System:   PowerChart ; Last Updated:   01/19/2015 9:39 EST ; Life Cycle Date:   01/19/2015 ; Life Cycle Status:   Active ; Vocabulary:   SNOMED CT        Wears glasses (SNOMED CT  :941740814 )  Name of Problem:   Wears glasses ; Recorder:   LUTZ, RN, [name]; Confirmation:   Confirmed ; Classification:   Patient Stated ; Code:   [id number] ; Contributor System:   Dietitian ; Last Updated:   06/29/2018  15:20 EDT ; Life Cycle Date:   06/29/2018 ; Life Cycle Status:   Active ; Vocabulary:   SNOMED CT          Diagnoses(Active)    Encounter for screening for other viral diseases  Date:   03/24/2019 ; Confirmation:   Confirmed ; Clinical Dx:   Encounter for screening for other viral diseases ; Classification:   Medical ; Clinical Service:   Non-Specified ; Code:   ICD-10-CM ; Probability:   0 ; Diagnosis Code:   Z11.59        Procedure History        -    Procedure History    (As Of: 03/28/2019 09:06:54 EDT)     Procedure Dt/Tm:   01/22/2014 ; Anesthesia Minutes:   0 ; Procedure Name:   Extraction of wisdom tooth ; Procedure Minutes:   0            Procedure Dt/Tm:   01/13/2013 ; Anesthesia Minutes:   0 ; Procedure Name:   Arthroscopy ; Procedure Minutes:   0 ; Comments:     01/19/2015 9:40 EST - MCBRIDE, RN, ERIN R  RIGHT KNEE            Procedure Dt/Tm:   01/19/2015 13:19:00 EST ; Location:   [facility] ; Provider:   LAMB-MD,  JOSHUA H; Anesthesia Type:   General ; :   FERLA-MD,  BRIAN P; Anesthesia Minutes:   0 ; Procedure Name:   Ankle Achilles Tendon Repair (Ankle, Left) ; Procedure Minutes:   74 ; Comments:     01/19/2015 14:45 EST - ZEIF, RN, REBECCA N  auto-populated from documented surgical case ; Clinical Service:   Surgery            Procedure Dt/Tm:   02/12/2015 16:52:00 EST ; Location:   [facility] ; Provider:   LAMB-MD,  JOSHUA H; Anesthesia Type:   General ; :   FERLA-MD,  BRIAN P; Anesthesia Minutes:   0 ; Procedure Name:   Ankle Achilles Tendon Repair (Left) ; Procedure Minutes:   83 ; Comments:     02/12/2015 18:26 EST - Rock Nephew, RN, RON D  auto-populated from documented surgical case ; Clinical Service:   Surgery            Procedure Dt/Tm:   02/12/2015 16:52:00 EST ; Location:   [facility] ; Provider:   LAMB-MD,  JOSHUA H; Anesthesia Type:   General ; :   FERLA-MD,  BRIAN P; Anesthesia Minutes:   0 ; Procedure Name:   Ankle and/or Foot Incision and Drainage (Left) ; Procedure Minutes:   61 ; Comments:     02/12/2015 18:26 EST - Rock Nephew, RN, RON D  auto-populated from documented surgical case ; Clinical Service:   Surgery            Procedure Dt/Tm:   03/04/2015 13:36:00 EST ; Location:   [facility] ; Provider:   Leighton Ruff; Anesthesia Type:   General ; :   Thomes Lolling; Anesthesia Minutes:   0 ; Procedure Name:   Ankle and/or Foot Incision and Drainage ; Procedure Minutes:   1502 ; Comments:     03/04/2015 15:04 EST - DANIEL, RN, MELISSA S  auto-populated from documented surgical case ;  Clinical Service:   Surgery            Procedure Dt/Tm:   07/26/2017 15:26:00 EDT ; Location:   [facility] ;  Provider:   Marcine MatarHLSON-MD,  BLAKE; Anesthesia Type:   General ; :   PENDARVIS-MD, JR, ALLEN E; Anesthesia Minutes:   0 ; Procedure Name:   Ankle Achilles Tendon Repair (Right) ; Procedure Minutes:   31 ; Comments:     07/26/2017 16:05 EDT - Stacie AcresFelice, RN, Elizabeth  auto-populated from documented surgical case ; Clinical Service:   Surgery            Procedure Dt/Tm:   10/13/2017 12:32:00 EDT ; Location:   [facility] ; Provider:   Marcine MatarHLSON-MD,  BLAKE; Anesthesia Type:   General ; :   CHOI-MD,  YOUNG; Anesthesia Minutes:   0 ; Procedure Name:   Ankle and/or Foot Incision and Drainage (Right) ; Procedure Minutes:   20 ; Comments:     10/13/2017 13:00 EDT - ABELS, RN, PENNE L  auto-populated from documented surgical case ; Clinical Service:   Surgery            Procedure Dt/Tm:   03/28/2019 ; Anesthesia Minutes:   0 ; Procedure Name:   LUMBAR PUNCTURE ; Procedure Minutes:   0 ; Comments:     03/28/2019 9:05 EDT - Luciana AxeANKIN, RN, GRACE T  recurrent q2264month as of 2020 ; Last Reviewed Dt/Tm:   03/28/2019 09:05:49 EDT            History Confirmation   Problem History Changes PAT :   Yes   Procedure History Changes PAT :   No   RANKIN, RN, GRACE T - 03/28/2019 9:04 EDT   Anesthesia/Sedation   Anesthesia History :   Prior general anesthesia   SN - Malignant Hyperthermia :   Denies   Previous Problem with Anesthesia :   None   Moderate Sedation History :   Prior sedation for procedure   Previous Problem With Sedation :   None   Symptoms of Sleep Apnea :   BMI greater than 35   Symptoms of Sleep Apnea Score :   1    Pregnancy Status :   Patient denies   Last Menstrual Period :   03/07/2019 EST   Ashok CordiaRANCE, RN, CATHERINE - 03/24/2019 11:54 EST   Bloodless Medicine   Is Blood Transfusion Acceptable to Patient :   Yes   Ashok CordiaRANCE, RN, CATHERINE - 03/24/2019 11:54 EST   ID Risk Screen Symptoms   Recent Travel History :   No recent travel   Close Contact with  COVID-19 ID :   Preadmission testing patients only   Ashok CordiaRANCE, RN, CATHERINE - 03/24/2019 11:54 EST   Last 14 days COVID-19 ID :   Yes - Not Detected (negative)   RANKIN, RN, GRACE T - 03/28/2019 9:04 EDT     TB Symptom Screen :   No symptoms   C. diff Symptom/History ID :   Neither of the above   Patient Pregnant :   None of the above   Pryor CreekORRANCE, RN, CATHERINE - 03/24/2019 11:54 EST   Immunizations   COVID-19 Vaccine Status :   Not received   Luciana AxeANKIN, RN, GRACE T - 03/28/2019 9:04 EDT   ID COVID-19 Screen   Fever OR Chills :   No   Headache :   No   New or Worsening Cough :   No   Fatigue :   No   Shortness of Breath ID :   No   Myalgia (Muscle Pain) :   No   Dyspnea :   No   Diarrhea :  No   Sore Throat :   No   Nausea :   No   Laryngitis :   No   Sudden Loss of Taste or Smell :   No   Ashok Cordia RN, CATHERINE - 03/24/2019 11:54 EST   Social History   Social History   (As Of: 03/28/2019 09:06:54 EDT)   Tobacco:        Never smoker   (Last Updated: 02/10/2015 16:37:45 EST by Colon Branch, RN, MELISSA)          Alcohol:        Denies   (Last Updated: 01/19/2015 09:41:36 EST by Adin Hector, RN, ERIN R)          Substance Use:        Denies   (Last Updated: 07/26/2017 12:21:43 EDT by Elijah Birk, RN, TARA J)            Advance Directive   Advance Directive :   Osker Mason, RN, CATHERINE - 03/24/2019 11:54 EST   PAT Patient Instructions   Patient Arrival Time PAT :   03/28/2019 8:30 EDT   Ashok Cordia, RN, CATHERINE - 03/24/2019 11:54 EST   Medications in AM :   ACETAZOLAMIDE, JUNEL (VALIUM IF PRESCRIBED)   Ashok Cordia, RN, CATHERINE - 03/24/2019 12:04 EST     Medication Understanding :   [place] Gammon understanding   NPO PAT :   Other: NPO 2 HOURS PRIOR TO PROCEDURE   Ashok Cordia, RN, CATHERINE - 03/24/2019 11:54 EST   PAT Instructions Grid   Make up Understanding :   Verbalizes understanding   Jewelry Understanding :   Verbalizes understanding   Perfume Understanding :   Verbalizes understanding   Valuables Understanding :   Verbalizes understanding    Smoking Understanding :   Verbalizes understanding   Clothing Understanding :   Verbalizes understanding   Contact MD for Illness :   Financial trader MD for skin injury :   Verbalizes understanding   TORRANCE, RN, CATHERINE - 03/24/2019 11:54 EST   Service Line PAT :   IR   Laterality PAT :   N/A   Prep PAT :   Hibiclens   Name of Contact PAT :   [name]   Relationship of Contact PAT :   MOTHER   Contact Number PAT :   [phone]   Transportation Instructions PAT :   Accompany to Hospital, Transport Home, Remain with 24 hours post-procedure   Additional Contacts PAT :   03/25/19 COVID TEST RIVERS   Ashok Cordia, RN, CATHERINE - 03/24/2019 11:54 EST   Harm Screen   Injuries/Abuse/Neglect in Household :   Denies   Feels Unsafe at Home :   No   Last 3 mo, thoughts killing self/others :   Patient denies   Luciana Axe, RN, GRACE T - 03/28/2019 9:04 EDT

## 2019-03-26 LAB — COVID-19: SARS-CoV-2: NOT DETECTED

## 2019-03-28 NOTE — H&P (Signed)
Preoperative H&P: General *        Patient:   Carrie Shields, Carrie Shields            MRN: [id number]            FIN: [id number]               Age:   24 years     Sex:  Female     DOB:  12/02/1995   Associated Diagnoses:   None   Author:   Letta Kocher B-MD      Chief Complaint   Pseudotumor      Health Status   Allergies:    Allergic Reactions (Selected)  Moderate  Penicillin- Rash.  Unknown  Augmentin- Hives / itching.  Ertapenem- Hives / itching.,    Allergies (3) Active Reaction  penicillin Rash  Augmentin Hives / Itching  ertapenem Hives / Itching     Current medications:    Home Medications (6) Active  acetaZOLAMIDE 250 mg oral tablet 500 mg = 2 tabs, Oral, qAM  acetaZOLAMIDE 250 mg oral tablet 750 mg = 3 tabs, Oral, qPM  FLUoxetine 20 mg oral capsule 40 mg = 2 caps, Oral, Daily  Junel Fe 1/20 oral tablet 1 tabs, Oral, qAM  lamoTRIgine 150 mg oral tablet 150 mg = 1 tabs, Oral, Daily  Valium 10 mg oral tablet 10 mg = 1 tabs, PRN, Oral, On Call  ,    No qualifying data available     Problem list:    Patient Stated (Selected)  Anxiety and depression / [id number] / Confirmed  Migraine / [id number] / Confirmed  Intracranial hypertension / 381017510 / Confirmed  Seasonal allergies / [phone] / Confirmed  Wears glasses / [phone] / Confirmed,    Active Problems (5)  Anxiety and depression   Intracranial hypertension   Migraine   Seasonal allergies   Wears glasses         Histories   Past Medical History:    Resolved  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   Procedure history:    LUMBAR PUNCTURE on 03/28/2019 at 23 Years.  Comments:  03/28/2019 9:05 EDT - Zadie Rhine, RN, GRACE T  recurrent q80month as of 2020  Ankle and/or Foot Incision and Drainage (Right) on 10/13/2017 at 22 Years.  Comments:  10/13/2017 13:00 EDT - ABELS, RN, PENNE L  auto-populated from documented surgical case  Ankle Achilles Tendon Repair (Right) on  07/26/2017 at 22 Years.  Comments:  04/10/9240 68:34 EDT - Loma Boston, RN, Benjamine Mola  auto-populated from documented surgical case  Ankle and/or Foot Incision and Drainage on 03/04/2015 at 19 Years.  Comments:  03/04/2015 15:04 EST - DANIEL, RN, MELISSA S  auto-populated from documented surgical case  Ankle Achilles Tendon Repair (Left) on 02/12/2015 at 15 Years.  Comments:  02/12/2015 18:26 EST - WELLS, RN, RON D  auto-populated from documented surgical case  Ankle and/or Foot Incision and Drainage (Left) on 02/12/2015 at 19 Years.  Comments:  02/12/2015 18:26 EST - WELLS, RN, RON D  auto-populated from documented surgical case  Ankle Achilles Tendon Repair (Ankle, Left) on 01/19/2015 at 19 Years.  Comments:  01/19/2015 14:45 EST - ZEIF, RN, REBECCA N  auto-populated from documented surgical case  Extraction of wisdom tooth ([id number]) on 01/22/2014 at 67 Years.  Arthroscopy ([id number]) on 01/13/2013 at 17 Years.  Comments:  01/19/2015 9:40 EST - MCBRIDE, RN, ERIN R  RIGHT KNEE  Physical Examination   Vital Signs   03/28/2019 8:21 EDT Systolic Blood Pressure 114 mmHg    Diastolic Blood Pressure 69 mmHg    Temperature Oral 37 degC    Heart Rate Monitored 67 bpm    Respiratory Rate 18 br/min    SpO2 99 %    SBP/DBP Cuff Details Left arm         Vital Signs (last 24 hrs)_____  Last Charted___________  Temp Oral     37 degC  (MAR 15 08:21)  Resp Rate         18 br/min  (MAR 15 08:21)  SBP      114 mmHg  (MAR 15 08:21)  DBP      69 mmHg  (MAR 15 08:21)  SpO2      99 %  (MAR 15 08:21)  Weight      127 kg  (MAR 15 08:53)  Height      182.9 cm  (MAR 15 08:53)  BMI      37.96  (MAR 15 08:58)     Measurements from flowsheet : Measurements   03/28/2019 8:58 EDT Body Mass Index est meas 37.96 kg/m2   03/28/2019 8:58 EDT Body Mass Index Measured 37.96 kg/m2   03/28/2019 8:53 EDT Height/Length Measured 182.9 cm    Weight Measured 127 kg      General:  Alert and oriented.    Respiratory:  Respirations are non-labored.    Cardiovascular:  Normal rate.        Review / Management   Results review:     No qualifying data available.       Impression and Plan   Diagnosis     Pseudotumor    plan: fluoro LP.     Signature Line     Electronically Signed on 03/28/2019 10:57 AM EDT   ________________________________________________   Casandra Doffing B-MD

## 2019-03-28 NOTE — Nursing Note (Signed)
Nursing Discharge Summary - Text       Nursing Discharge Summary Entered On:  03/28/2019 12:08 EDT    Performed On:  03/28/2019 12:08 EDT by Luciana Axe, RN, GRACE T               DC Information   Discharge To :   Home independently, Family support   Mode of Discharge :   Wheelchair   Transportation :   Private vehicle   Accompanied By :   Roe Rutherford, RN, GRACE T - 03/28/2019 12:08 EDT

## 2019-03-28 NOTE — Procedures (Signed)
Lumbar Puncture Procedure        Patient:   Carrie Shields, Carrie Shields            MRN: [id number]            FIN: [id number]               Age:   24 years     Sex:  Female     DOB:  05-Dec-1995   Associated Diagnoses:   None   Author:   Casandra Doffing B-MD      Procedure   Lumbar puncture procedure   Date/ Time:  03/28/2019 11:32:00.     Time Out: Completed.     Indication: drainage of increased CSF.     Preparation: Povidone-Iodine.     Technique: Needle size 22 gauge - 3.5 inches, Number of attempts === single attempt at L2.     Sedation: No.     Sterile Technique: Hand Hygiene, Sterile Drape, Sterile Gloves, Mask.     Findings: opening pressure  36  mm H2O, closing pressure  17  mm H2O, 26  ml fluid obtained.     Procedure tolerated: well.     Signature Line     Electronically Signed on 03/28/2019 11:42 AM EDT   ________________________________________________   Casandra Doffing B-MD

## 2019-03-28 NOTE — Op Note (Signed)
Recovery Phase III Record - [facility]             Recovery Phase III Record - [facility] Summary                                                        Primary Physician:        Ardyth Harps F-MD    Case Number:              [id number]    Finalized Date/Time:      03/28/19 13:51:09    Pt. Name:                 Carrie Shields, Carrie Shields    D.O.B./Sex:               05-23-1995    Female    Med Rec #:                [id number]    Physician:                Casandra Doffing B-MD    Financial #:              [id number]    Pt. Type:                 O    Room/Bed:                 /    Admit/Disch:              03/28/19 08:25:00 -    Institution:       [facility] Case Attendance - Phase III                                                                                          Entry 1                                                                                                          Case Attendee             Ardyth Harps F-MD            Role Performed                  Surgeon Primary    Last Modified By:         Luciana Axe, RN, GRACE T  03/28/19 11:59:19      [facility] - Case Times - Phase III                                                                                             Entry 1                                                                                                          Phase III In              03/28/19 11:50:00               Phase III Out                   03/28/19 13:51:00    Phase III Discharge       03/28/19 13:51:00    Time     Last Modified By:         Zadie Rhine, [place], GRACE T                              03/28/19 13:51:08              Finalized By: Zadie Rhine RN, GRACE T      Document Signatures                                                                             Signed By:           Zadie Rhine, [place], GRACE T 03/28/19 13:51

## 2019-03-28 NOTE — Assessment & Plan Note (Signed)
Pre-Procedure Record - [facility]             Pre-Procedure Record - [facility] Summary                                                             Primary Physician:        Ardyth Harps F-MD    Case Number:              [id number]    Finalized Date/Time:      03/28/19 09:09:51    Pt. Name:                 Carrie Shields, Carrie Shields    D.O.B./Sex:               Nov 04, 1995    Female    Med Rec #:                [id number]    Physician:                Ardyth Harps F-MD    Financial #:              [id number]    Pt. Type:                 O    Room/Bed:                 /    Admit/Disch:              03/28/19 08:25:00 -    Institution:       [facility] Case Attendance - Pre-Procedure                                                                                      Entry 1                                                                                                          Case Attendee             Ardyth Harps F-MD            Role Performed                  Surgeon Primary    Last Modified By:         Luciana Axe, RN, GRACE T  03/28/19 08:43:41      [facility] - Case Times - Pre-Procedure                                                                                         Entry 1                                                                                                          Patient In Room Time      03/28/19 08:21:00               Nurse In Time                   03/28/19 08:46:00    Nurse Out Time            03/28/19 09:09:00               Patient Ready for               03/28/19 09:09:00                                                              Surgery/Procedure     Last Modified By:         Zadie Rhine, RN, GRACE T                              03/28/19 09:09:45    General Comments:            registered at [facility] [place] By: Zadie Rhine RN, GRACE T      Document Signatures                                                                             Signed By:           Zadie Rhine, RN, GRACE T  03/28/19 09:09

## 2019-05-19 NOTE — Nursing Note (Signed)
Adult Admission Assessment - Text       Perioperative Admission Assessment Entered On:  05/19/2019 10:04 EDT    Performed On:  05/19/2019 10:02 EDT by Kenard Gower RN, Cora Collum               General   Call Start :   05/20/2019 13:08 EDT   Call Complete :   05/20/2019 13:16 EDT   Height/Length Estimated :   182.8 cm(Converted to: 6.00 ft, 71.97 in)    Weight   Estimated :   124.2 kg(Converted to: 273.814 lb)    Body Mass Index Estimated :   37.17 kg/m2   Day of Proc Supp Prsn is the Emerg Cont :   Yes   Day of Procedure Support Person Name :   ELDANA ISIP   Day of Procedure Support Person Phone :   [phone]   Day of Procedure Support Person Relationship :   MOTHER   Languages :   English   Preferred Communication Mode :   Verbal   Information Given By :   Charlann Boxer, RN, Cora Collum - 05/20/2019 13:07 EDT     Primary Care Physician/Specialists :   DR. Cyril Mourning, PCP  DR. THOMAS HUGHES-NEURO   STOREN, RN, Cora Collum - 05/19/2019 10:02 EDT   Allergies   (As Of: 05/27/2019 08:53:46 EDT)   Allergies (Active)   Augmentin  Estimated Onset Date:   Unspecified ; Reactions:   Hives / Itching ; Created By:   Lady Gary, RN, [name]; Reaction Status:   Active ; Category:   Drug ; Substance:   Augmentin ; Type:   Allergy ; Severity:   Unknown ; Updated By:   Lady Gary, RN, Charm Barges; Reviewed Date:   05/27/2019 8:52 EDT      ertapenem  Estimated Onset Date:   Unspecified ; Reactions:   Hives / Itching ; Created By:   Lady Gary, RN, [name]; Reaction Status:   Active ; Category:   Drug ; Substance:   ertapenem ; Type:   Allergy ; Severity:   Unknown ; Updated By:   Lady Gary RN, Charm Barges; Reviewed Date:   05/27/2019 8:52 EDT      penicillin  Estimated Onset Date:   Unspecified ; Reactions:   Rash ; Created By:   Jorge Mandril, RN, [name]; Reaction Status:   Active ; Category:   Drug ; Substance:   penicillin ; Type:   Allergy ; Severity:   Moderate ; Updated By:   Jorge Mandril, RN, Vista Lawman; Reviewed Date:   05/27/2019 8:52 EDT        Medication History    Medication List   (As Of: 05/27/2019 08:53:46 EDT)   Home Meds    acetaZOLAMIDE  :   acetaZOLAMIDE ; Status:   Documented ; Ordered As Mnemonic:   acetaZOLAMIDE 500 mg oral capsule, extended release ; Simple Display Line:   1,000 mg, 2 caps, Oral, BID, 30 cap, 0 Refill(s) ; Catalog Code:   acetaZOLAMIDE ; Order Dt/Tm:   05/20/2019 13:18:18 EDT          diazepam  :   diazepam ; Status:   Documented ; Ordered As Mnemonic:   Valium 10 mg oral tablet ; Simple Display Line:   10 mg, 1 tabs, Oral, On Call, pre-procedures, PRN: other (see comment), 0 Refill(s) ; Catalog Code:   diazepam ; Order Dt/Tm:   03/28/2019 09:08:51 EDT  FLUoxetine  :   FLUoxetine ; Status:   Documented ; Ordered As Mnemonic:   FLUoxetine 20 mg oral capsule ; Simple Display Line:   40 mg, 2 caps, Oral, qAM, 0 Refill(s) ; Catalog Code:   FLUoxetine ; Order Dt/Tm:   03/24/2019 11:59:17 EST          lamoTRIgine  :   lamoTRIgine ; Status:   Documented ; Ordered As Mnemonic:   lamoTRIgine 150 mg oral tablet ; Simple Display Line:   150 mg, 1 tabs, Oral, qAM, 0 Refill(s) ; Catalog Code:   lamoTRIgine ; Order Dt/Tm:   03/24/2019 11:59:17 EST          norethindrone-ethinyl estradiol  :   norethindrone-ethinyl estradiol ; Status:   Documented ; Ordered As Mnemonic:   Junel Fe 1/20 oral tablet ; Simple Display Line:   1 tabs, Oral, qAM, 0 Refill(s) ; Catalog Code:   norethindrone-ethinyl estradiol ; Order Dt/Tm:   03/24/2019 11:59:17 EST            Problem History   (As Of: 05/27/2019 08:53:46 EDT)   Problems(Active)    Anxiety and depression (SNOMED CT  :16109604 )  Name of Problem:   Anxiety and depression ; Recorder:   Celine Ahr, RN, [name]; Confirmation:   Confirmed ; Classification:   Patient Stated ; Code:   [id number] ; Contributor System:   PowerChart ; Last Updated:   10/09/2017 9:08 EDT ; Life Cycle Date:   10/09/2017 ; Life Cycle Status:   Active ; Vocabulary:   SNOMED CT        Blurred vision (SNOMED CT  :147829562 )  Name of Problem:   Blurred vision  ; Recorder:   Milderd Meager RN, Candiss Norse; Confirmation:   Confirmed ; Classification:   Patient Stated ; Code:   [id number] ; Contributor System:   Conservation officer, nature ; Last Updated:   05/20/2019 13:23 EDT ; Life Cycle Date:   05/20/2019 ; Life Cycle Status:   Active ; Vocabulary:   SNOMED CT        History of COVID-19 (SNOMED CT  :6962952841 )  Name of Problem:   History of COVID-19 ; Onset Date:   07/2018 ; Recorder:   STOREN, RN, Candiss Norse; Confirmation:   Confirmed ; Classification:   Patient Stated ; Code:   [id number] ; Contributor System:   PowerChart ; Last Updated:   05/20/2019 13:12 EDT ; Life Cycle Status:   Active ; Vocabulary:   SNOMED CT        Intracranial hypertension (SNOMED CT  :536644034 )  Name of Problem:   Intracranial hypertension ; Recorder:   LUTZ, RN, [name]; Confirmation:   Confirmed ; Classification:   Patient Stated ; Code:   [id number] ; Contributor System:   Conservation officer, nature ; Last Updated:   06/29/2018 15:20 EDT ; Life Cycle Date:   06/29/2018 ; Life Cycle Status:   Active ; Vocabulary:   SNOMED CT        Migraine (SNOMED CT  :75643329 )  Name of Problem:   Migraine ; Recorder:   Belenda Cruise, RN, Ross Ludwig; Confirmation:   Confirmed ; Classification:   Patient Stated ; Code:   [id number] ; Contributor System:   PowerChart ; Last Updated:   06/14/2018 14:33 EDT ; Life Cycle Date:   06/14/2018 ; Life Cycle Status:   Active ; Vocabulary:   SNOMED CT        Seasonal allergies (SNOMED CT  [phone] )  Name of Problem:   Seasonal allergies ; Recorder:   MCBRIDE, RN, ERIN R; Confirmation:   Confirmed ; Classification:   Patient Stated ; Code:   [phone] ; Contributor System:   PowerChart ; Last Updated:   01/19/2015 9:39 EST ; Life Cycle Date:   01/19/2015 ; Life Cycle Status:   Active ; Vocabulary:   SNOMED CT        Wears glasses (SNOMED CT  :272536644 )  Name of Problem:   Wears glasses ; Recorder:   LUTZ, RN, [name]; Confirmation:   Confirmed ; Classification:   Patient  Stated ; Code:   [phone] ; Contributor System:   Dietitian ; Last Updated:   06/29/2018 15:20 EDT ; Life Cycle Date:   06/29/2018 ; Life Cycle Status:   Active ; Vocabulary:   SNOMED CT          Diagnoses(Active)    Encounter for screening for other viral diseases  Date:   05/20/2019 ; Confirmation:   Confirmed ; Clinical Dx:   Encounter for screening for other viral diseases ; Classification:   Medical ; Clinical Service:   Non-Specified ; Code:   ICD-10-CM ; Probability:   0 ; Diagnosis Code:   Z11.59        Procedure History        -    Procedure History   (As Of: 05/27/2019 08:53:46 EDT)     Procedure Dt/Tm:   01/22/2014 ; Anesthesia Minutes:   0 ; Procedure Name:   Extraction of wisdom tooth ; Procedure Minutes:   0 ; Last Reviewed Dt/Tm:   05/27/2019 08:53:28 EDT            Procedure Dt/Tm:   01/13/2013 ; Anesthesia Minutes:   0 ; Procedure Name:   Arthroscopy ; Procedure Minutes:   0 ; Comments:     01/19/2015 9:40 EST - Adin Hector, RN, ERIN R  RIGHT KNEE ; Last Reviewed Dt/Tm:   05/27/2019 08:53:28 EDT            Procedure Dt/Tm:   01/19/2015 13:19:00 EST ; Location:   [facility] ; Provider:   LAMB-MD,  JOSHUA H; Anesthesia Type:   General ; :   FERLA-MD,  BRIAN P; Anesthesia Minutes:   0 ; Procedure Name:   Ankle Achilles Tendon Repair (Ankle, Left) ; Procedure Minutes:   39 ; Comments:     01/19/2015 14:45 EST - ZEIF, RN, REBECCA N  auto-populated from documented surgical case ; Clinical Service:   Surgery ; Last Reviewed Dt/Tm:   05/27/2019 08:53:28 EDT            Procedure Dt/Tm:   02/12/2015 16:52:00 EST ; Location:   [facility] ; Provider:   LAMB-MD,  JOSHUA H; Anesthesia Type:   General ; :   FERLA-MD,  BRIAN P; Anesthesia Minutes:   0 ; Procedure Name:   Ankle Achilles Tendon Repair (Left) ; Procedure Minutes:   35 ; Comments:     02/12/2015 18:26 EST - WELLS, RN, RON D  auto-populated from documented surgical case ; Clinical Service:   Surgery ; Last Reviewed Dt/Tm:   05/27/2019 08:53:28 EDT            Procedure Dt/Tm:    02/12/2015 16:52:00 EST ; Location:   [facility] ; Provider:   LAMB-MD,  JOSHUA H; Anesthesia Type:   General ; :   FERLA-MD,  BRIAN P; Anesthesia Minutes:   0 ; Procedure Name:  Ankle and/or Foot Incision and Drainage (Left) ; Procedure Minutes:   28 ; Comments:     02/12/2015 18:26 EST - WELLS, RN, RON D  auto-populated from documented surgical case ; Clinical Service:   Surgery ; Last Reviewed Dt/Tm:   05/27/2019 08:53:28 EDT            Procedure Dt/Tm:   03/04/2015 13:36:00 EST ; Location:   [facility] ; Provider:   Sibyl Parr; Anesthesia Type:   General ; :   Jenetta Loges; Anesthesia Minutes:   0 ; Procedure Name:   Ankle and/or Foot Incision and Drainage ; Procedure Minutes:   1502 ; Comments:     03/04/2015 15:04 EST - DANIEL, RN, MELISSA S  auto-populated from documented surgical case ; Clinical Service:   Surgery ; Last Reviewed Dt/Tm:   05/27/2019 08:53:28 EDT            Procedure Dt/Tm:   07/26/2017 15:26:00 EDT ; Location:   [facility] ; Provider:   Marcine Matar; Anesthesia Type:   General ; :   PENDARVIS-MD, JR, ALLEN E; Anesthesia Minutes:   0 ; Procedure Name:   Ankle Achilles Tendon Repair (Right) ; Procedure Minutes:   31 ; Comments:     07/26/2017 16:05 EDT - Stacie Acres, RN, Elizabeth  auto-populated from documented surgical case ; Clinical Service:   Surgery ; Last Reviewed Dt/Tm:   05/27/2019 08:53:28 EDT            Procedure Dt/Tm:   10/13/2017 12:32:00 EDT ; Location:   [facility] ; Provider:   Marcine Matar; Anesthesia Type:   General ; :   CHOI-MD,  YOUNG; Anesthesia Minutes:   0 ; Procedure Name:   Ankle and/or Foot Incision and Drainage (Right) ; Procedure Minutes:   20 ; Comments:     10/13/2017 13:00 EDT - ABELS, RN, PENNE L  auto-populated from documented surgical case ; Clinical Service:   Surgery ; Last Reviewed Dt/Tm:   05/27/2019 08:53:28 EDT            Procedure Dt/Tm:   03/28/2019 ; Anesthesia Minutes:   0 ; Procedure Name:   LUMBAR PUNCTURE ; Procedure Minutes:   0 ; Comments:     03/28/2019  9:05 EDT - Luciana Axe, RN, GRACE T  recurrent q2month as of 2020 ; Last Reviewed Dt/Tm:   05/27/2019 08:53:28 EDT            History Confirmation   Problem History Changes PAT :   No   Procedure History Changes PAT :   No   FICHMAN, RN, MONICA H - 05/27/2019 8:52 EDT   Anesthesia/Sedation   Anesthesia History :   Prior general anesthesia   SN - Malignant Hyperthermia :   Denies   Previous Problem with Anesthesia :   None   Moderate Sedation History :   Prior sedation for procedure   Previous Problem With Sedation :   None   Symptoms of Sleep Apnea :   No OSA Symptoms   Symptoms of Sleep Apnea Score :   0    Shortness of Breath Indicator :   No shortness of breath   Pregnancy Status :   Patient denies   Last Menstrual Period :   05/02/2019 EDT   Kenard Gower, RN, Cora Collum - 05/20/2019 13:07 EDT   Bloodless Medicine   Is Blood Transfusion Acceptable to Patient :   Yes   STOREN, RN, Randa Evens  L - 05/20/2019 13:07 EDT   ID Risk Screen Symptoms   Recent Travel History :   No recent travel   Close Contact with COVID-19 ID :   Preadmission testing patients only   Last 14 days COVID-19 ID :   No   TB Symptom Screen :   No symptoms   C. diff Symptom/History ID :   Neither of the above   STOREN, RN, Cora CollumJOANNE L - 05/20/2019 13:07 EDT   Immunizations   COVID-19 Vaccine Status :   Not received   STOREN, RN, Randa EvensJOANNE L - 05/20/2019 13:07 EDT   ID COVID-19 Screen   Fever OR Chills :   No   Headache :   No   New or Worsening Cough :   No   Fatigue :   No   Shortness of Breath ID :   No   Myalgia (Muscle Pain) :   No   Dyspnea :   No   Diarrhea :   No   Sore Throat :   No   Nausea :   No   Laryngitis :   No   Sudden Loss of Taste or Smell :   No   Kenard GowerSTOREN, RCora Collum, JOANNE L - 05/20/2019 13:07 EDT   Social History   Social History   (As Of: 05/27/2019 08:53:46 EDT)   Tobacco:        Never smoker   (Last Updated: 02/10/2015 16:37:45 EST by Colon BranchARSON, RN, MELISSA)          Electronic Cigarette/Vaping:        Never Electronic Cigarette Use.   (Last Updated: 05/20/2019 13:14:36  EDT by Kenard GowerSTOREN, RN, JOANNE L)          Alcohol:        Current, 1-2 times per month   (Last Updated: 05/20/2019 13:14:52 EDT by Kenard GowerSTOREN, RN, Cora CollumJOANNE L)          Substance Use:        Opioid Naive - not currently taking opioids, Denies   (Last Updated: 05/20/2019 13:15:07 EDT by Kenard GowerSTOREN, RN, Cora CollumJOANNE L)            Advance Directive   Advance Directive :   No   Kenard GowerSTOREN, RN, Cora CollumJOANNE L - 05/20/2019 13:07 EDT   PAT/Clinic Comments   Additional Comments PAT :   FOR COVID TESTING 05/24/19     Kenard GowerSTOREN, RN, Cora CollumJOANNE L - 05/20/2019 13:07 EDT   Harm Screen   Injuries/Abuse/Neglect in Household :   Denies   Feels Unsafe at Home :   No   FICHMAN, RN, MONICA H - 05/27/2019 8:52 EDT

## 2019-05-25 LAB — COVID-19: SARS COV2, NAA (BD): NOT DETECTED

## 2019-05-27 LAB — CELL COUNT WITH DIFFERENTIAL, CSF
Lymphocyte, CSF: 89 % — ABNORMAL HIGH (ref 40–80)
Monos,Csf: 11 % — ABNORMAL LOW (ref 15–45)
RBC, CSF: 0 uL (ref 0–0)
Total Nucleated Cells CSF: 1 uL (ref 0–5)
Volume, CSF: 6.5 mL
Xanthochromia: NEGATIVE

## 2019-05-27 LAB — GLUCOSE, CSF: Glucose, CSF: 59 mg/dL (ref 40–70)

## 2019-05-27 LAB — PROTEIN, CSF: Csf Protein: 17.9 mg/dL (ref 15.0–45.0)

## 2019-05-27 NOTE — Op Note (Signed)
Recovery Phase III Record - [facility]             Recovery Phase III Record - [facility] Summary                                                        Primary Physician:        Priscella Mann                              KARL-MD    Case Number:              [id number]    Finalized Date/Time:      05/27/19 13:42:41    Pt. Name:                 Carrie Shields, Carrie Shields    D.O.B./Sex:               July 28, 1995    Female    Med Rec #:                [id number]    Physician:                Meredith Pel R-MD    Financial #:              [id number]    Pt. Type:                 O    Room/Bed:                 /    Admit/Disch:              05/27/19 08:38:00 -    Institution:       [facility] Case Attendance - Phase III                                                                                          Entry 1                                                                                                          Case Attendee             Priscella Mann               Role Performed                  Surgeon Primary  KARL-MD    Last Modified By:         Gurney Maxin, RN, MONICA H                              05/27/19 11:22:02      [facility] - Case Times - Phase III                                                                                             Entry 1                                                                                                          Phase III In              05/27/19 11:22:00               Phase III Out                   05/27/19 13:42:00    Phase III Discharge       05/27/19 13:42:00    Time     Last Modified By:         Gurney Maxin, RN, MONICA H                              05/27/19 13:42:39              Finalized By: Gurney Maxin RN, MONICA H      Document Signatures                                                                             Signed By:           Gurney Maxin, RN, MONICA H 05/27/19 13:42

## 2019-05-27 NOTE — Assessment & Plan Note (Signed)
Pre-Procedure Record - [facility]             Pre-Procedure Record - [facility] Summary                                                             Primary Physician:        Ellin Goodie                              KARL-MD    Case Number:              [id number]    Finalized Date/Time:      05/27/19 08:59:45    Pt. Name:                 Carrie Shields, Carrie Shields    D.O.B./Sex:               05-12-1995    Female    Med Rec #:                [id number]    Physician:                Aretta Nip R-MD    Financial #:              [id number]    Pt. Type:                 O    Room/Bed:                 /    Admit/Disch:              05/27/19 08:38:00 -    Institution:       [facility] Case Attendance - Pre-Procedure                                                                                      Entry 1                         Entry 2                                                                          Case Attendee             Abelardo Diesel, RN, MONICA H                              KARL-MD    Role Performed  Surgeon Primary                 Preoperative Nurse    Time In     Time Out     Last Modified By:         Gurney Maxin RN, MONICA Marica Otter, RN, MONICA H                              05/27/19 08:37:07               05/27/19 08:37:07      [facility] - Case Times - Pre-Procedure                                                                                         Entry 1                                                                                                          Patient In Room Time      05/27/19 08:37:00               Nurse In Time                   05/27/19 08:37:00    Nurse Out Time            05/27/19 08:59:00               Patient Ready for               05/27/19 08:59:00                                                              Surgery/Procedure     Last Modified By:         Gurney Maxin, RN, MONICA H                              05/27/19 08:59:39              Finalized  By: Gurney Maxin, RN, MONICA H      Document Signatures  Signed By:           Gurney Maxin, RN, MONICA H 05/27/19 08:59

## 2019-05-27 NOTE — Nursing Note (Signed)
Nursing Discharge Summary - Text       Nursing Discharge Summary Entered On:  05/27/2019 11:29 EDT    Performed On:  05/27/2019 11:29 EDT by Gurney Maxin, RN, MONICA H               DC Information   Discharge To :   Home independently, Family support   Mode of Discharge :   Wheelchair   Dongola, RN, MONICA H - 05/27/2019 11:29 EDT

## 2019-05-27 NOTE — H&P (Signed)
Preoperative H&P: General *        Patient:   Carrie Shields, Carrie Shields            MRN: [id number]            FIN: [id number]               Age:   24 years     Sex:  Female     DOB:  05/15/1995   Associated Diagnoses:   None   Author:   Natale Lay      Preoperative Information   Pseudotumor cerebri      Health Status   Allergies:    Allergic Reactions (Selected)  Moderate  Penicillin- Rash.  Unknown  Augmentin- Hives / itching.  Ertapenem- Hives / itching.,    Allergies (3) Active Reaction  penicillin Rash  Augmentin Hives / Itching  ertapenem Hives / Itching     Current medications:  (Selected)   Documented Medications  Documented  FLUoxetine 20 mg oral capsule: 40 mg, 2 caps, Oral, qAM, 0 Refill(s)  Junel Fe 1/20 oral tablet: 1 tabs, Oral, qAM, 0 Refill(s)  Valium 10 mg oral tablet: 10 mg, 1 tabs, Oral, On Call, pre-procedures, PRN: other (see comment), 0 Refill(s)  acetaZOLAMIDE 500 mg oral capsule, extended release: 1,000 mg, 2 caps, Oral, BID, 30 cap, 0 Refill(s)  lamoTRIgine 150 mg oral tablet: 150 mg, 1 tabs, Oral, qAM, 0 Refill(s),    Home Medications (5) Active  acetaZOLAMIDE 500 mg oral capsule, extended release 1,000 mg = 2 caps, Oral, BID  FLUoxetine 20 mg oral capsule 40 mg = 2 caps, Oral, qAM  Junel Fe 1/20 oral tablet 1 tabs, Oral, qAM  lamoTRIgine 150 mg oral tablet 150 mg = 1 tabs, Oral, qAM  Valium 10 mg oral tablet 10 mg = 1 tabs, PRN, Oral, On Call  ,    No qualifying data available     Problem list:    Patient Stated (Selected)  Anxiety and depression / [id number] / Confirmed  Blurred vision / 703500938 / Confirmed  History of COVID-19 / 1829937169 / Confirmed  Migraine / 67893810 / Confirmed  Intracranial hypertension / 175102585 / Confirmed  Seasonal allergies / [phone] / Confirmed  Wears glasses / [phone] / Confirmed,    Active Problems (7)  Anxiety and depression   Blurred vision   History of COVID-19   Intracranial hypertension   Migraine   Seasonal allergies   Wears  glasses         Histories   Past Medical History:    Resolved  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   Family History:    No family history items have been selected or recorded.   Procedure history:    LUMBAR PUNCTURE on 03/28/2019 at 23 Years.  Comments:  03/28/2019 9:05 EDT - Zadie Rhine, RN, GRACE T  recurrent q36monthas of 2020  Ankle and/or Foot Incision and Drainage (Right) on 10/13/2017 at 22 Years.  Comments:  10/13/2017 13:00 EDT - ABELS, RN, PENNE L  auto-populated from documented surgical case  Ankle Achilles Tendon Repair (Right) on 07/26/2017 at 22 Years.  Comments:  72/42/6834119:62EDT - FLoma Boston RN, EBenjamine Mola auto-populated from documented surgical case  Ankle and/or Foot Incision and Drainage on 03/04/2015 at 19 Years.  Comments:  03/04/2015 15:04 EST - DANIEL, RN, MELISSA S  auto-populated from documented surgical case  Ankle Achilles Tendon Repair (Left) on 02/12/2015 at 89  Years.  Comments:  02/12/2015 18:26 EST - WELLS, RN, RON D  auto-populated from documented surgical case  Ankle and/or Foot Incision and Drainage (Left) on 02/12/2015 at 19 Years.  Comments:  02/12/2015 18:26 EST - WELLS, RN, RON D  auto-populated from documented surgical case  Ankle Achilles Tendon Repair (Ankle, Left) on 01/19/2015 at 19 Years.  Comments:  01/19/2015 14:45 EST - ZEIF, RN, REBECCA N  auto-populated from documented surgical case  Extraction of wisdom tooth ([id number]) on 01/22/2014 at 56 Years.  Arthroscopy ([id number]) on 01/13/2013 at 17 Years.  Comments:  01/19/2015 9:40 EST - MCBRIDE, RN, ERIN R  RIGHT KNEE   Social History        Social & Psychosocial Habits    Alcohol  05/20/2019  Use: Current    Frequency: 1-2 times per month    Substance Use  05/20/2019  Opioid Assessment Opioid Naive-not taking    Use: Denies    Tobacco  02/10/2015  Use: Never smoker    Electronic Cigarette/Vaping  05/20/2019  Electronic Cigarette Use: Never  .        Physical  Examination   Vital Signs   4/78/2956 2:13 EDT Systolic Blood Pressure 086 mmHg    Diastolic Blood Pressure 67 mmHg    Temperature Oral 36.8 degC    Heart Rate Monitored 63 bpm    Respiratory Rate 18 br/min    SpO2 98 %         Vital Signs (last 24 hrs)_____  Last Charted___________  Temp Oral     36.8 degC  (MAY 14 08:53)  Resp Rate         18 br/min  (MAY 14 08:53)  SBP      107 mmHg  (MAY 14 08:53)  DBP      67 mmHg  (MAY 14 08:53)  SpO2      98 %  (MAY 14 08:53)  Weight      124 kg  (MAY 14 08:53)  Height      182 cm  (MAY 14 08:53)  BMI      37.44  (MAY 14 08:55)     Measurements from flowsheet : Measurements   05/27/2019 8:55 EDT Body Mass Index est meas 37.44 kg/m2    Body Mass Index Measured 37.44 kg/m2   05/27/2019 8:53 EDT Height/Length Measured 182 cm    Patient Stated Height/Length 182 cm    Weight Measured 124 kg    Weight Dosing 124 kg    Patient Stated Weight 124 kg      General:  Alert and oriented.    Respiratory:  Lungs are clear to auscultation.    Cardiovascular:  Normal rate, Regular rhythm.    Psychiatric:  Cooperative.       Health Maintenance      Health Maintenance     Pending (in the next year)        Due            Cervical Cancer Screening due  05/27/19  and every 3  years           HPV Vaccine Dose 1 due  05/27/19  One-time only           Hepatitis A Dose 1 due  05/27/19  One-time only           Hepatitis B Dose 1 due  05/27/19  One-time only           MMR Vaccine Dose 1  due  05/27/19  One-time only           Meningococcal Dose 1 due  05/27/19  One-time only           Pertussis Vaccine due  05/27/19  One-time only           Tetanus/TD Vaccine due  05/27/19  and every 10  years           Varicella Vaccine Dose 1 due  05/27/19  One-time only        Due In Future            Influenza Vaccine not due until  09/14/19  and every 1  years     Satisfied (in the past 1 year)     There are no satisfied recommendations within the defined date range          Review / Management   Results review:      No qualifying data available, Lab results: 05/27/2019 8:55 EDT       Estimated Creatinine Clearance            185.04 mL/min  .       Impression and Plan   Fluoro-guided lumbar puncture, sending CSF per lab orders, pressures to be recorded   [employer] Signed on 05/27/2019 10:00 AM EDT   ________________________________________________   Natale Lay                     Reviewed by: Priscella Mann KARL-MD

## 2019-11-08 LAB — CELL COUNT WITH DIFFERENTIAL, CSF
Lymphocyte, CSF: 64 % (ref 40–80)
Monos,Csf: 14 % — ABNORMAL LOW (ref 15–45)
Polys, CSF: 22 % — ABNORMAL HIGH (ref 0–6)
RBC, CSF: 75 uL — ABNORMAL HIGH (ref 0–0)
Total Nucleated Cells CSF: 4 uL (ref 0–5)
Volume, CSF: 7.5 mL
Xanthochromia: NEGATIVE

## 2019-11-08 LAB — GLUCOSE, CSF: Glucose, CSF: 60 mg/dL (ref 40–70)

## 2019-11-08 LAB — PROTEIN, CSF: Csf Protein: 19.3 mg/dL (ref 15.0–45.0)

## 2019-11-08 NOTE — ED Provider Notes (Signed)
Headache        Patient:   Carrie Shields, Carrie Shields            MRN: [id number]            FIN: [id number]               Age:   24 years     Sex:  Female     DOB:  Aug 18, 1995   Associated Diagnoses:   Pseudotumor cerebri; Headache   Author:   Vangie Bicker      Basic Information   Additional information: Chief Complaint from Nursing Triage Note   Chief Complaint  Chief Complaint: pt co migraine last night with hx of same. states, "neurologist told me to come in before my vision gets bad or anything". would like a LP (11/08/19 11:13:00).      History of Present Illness   The patient presents with This is a 24 year old female with a history of pseudotumor cerebri who has had a headache and some blurry vision since last night.  She vomited last night.  She called her neurologist today and he told her "go to the ER for LP".  She denies any fevers or chills.  She has a little bit of nausea but the vomiting is cleared.  Her blurry vision is mild.  Her headache is improved a little bit.  But is bilateral.  She also states she has some "ear fullness" and she states this occurs when she gets high cerebral pressures..        Review of Systems   Constitutional symptoms:  No fever, no chills.    Eye symptoms:  Blurred vision, No diplopia,    ENMT symptoms:  No ear pain, no sore throat.    Respiratory symptoms:  No shortness of breath, no cough.    Cardiovascular symptoms:  No chest pain, no palpitations.    Gastrointestinal symptoms:  No vomiting, no diarrhea.    Genitourinary symptoms:  No dysuria, no hematuria.    Neurologic symptoms:  Headache, no numbness, no tingling, no focal weakness.              Additional review of systems information: All other systems reviewed and otherwise negative.      Health Status   Allergies:    Allergic Reactions (Selected)  Moderate  Penicillin- Rash.  Unknown  Augmentin- Hives / itching.  Ertapenem- Hives / itching..   Medications:  (Selected)   Documented Medications  Documented  FLUoxetine 20 mg  oral capsule: 40 mg, 2 caps, Oral, qAM, 0 Refill(s)  Junel Fe 1/20 oral tablet: 1 tabs, Oral, qAM, 0 Refill(s)  Valium 10 mg oral tablet: 10 mg, 1 tabs, Oral, On Call, pre-procedures, PRN: other (see comment), 0 Refill(s)  acetaZOLAMIDE 500 mg oral capsule, extended release: 1,000 mg, 2 caps, Oral, BID, 30 cap, 0 Refill(s)  lamoTRIgine 150 mg oral tablet: 150 mg, 1 tabs, Oral, qAM, 0 Refill(s).      Past Medical/ Family/ Social History   Medical history: Reviewed as documented in chart.   Surgical history: Reviewed as documented in chart.   Family history: Not significant.   Social history: Reviewed as documented in chart.   Problem list:    Active Problems (7)  Anxiety and depression   Blurred vision   History of COVID-19   Intracranial hypertension   Migraine   Seasonal allergies   Wears glasses   .      Physical Examination  Vital Signs   Vital Signs   11/08/2019 11:13 EDT Systolic Blood Pressure 119 mmHg    Diastolic Blood Pressure 84 mmHg    Temperature Oral 37 degC    Heart Rate Monitored 59 bpm  LOW    Respiratory Rate 15 br/min    SpO2 99 %   .   Measurements   11/08/2019 11:16 EDT Body Mass Index est meas 36.13 kg/m2    Body Mass Index Measured 36.13 kg/m2   11/08/2019 11:13 EDT Height/Length Measured 183 cm    Weight Dosing 121 kg   .   Basic Oxygen Information   11/08/2019 11:13 EDT     SpO2                      99 %  .   General:  Alert, no acute distress.    Skin:  Warm, dry, no rash.    Head:  Atraumatic.   Neck:  Trachea midline.   Eye:  Pupils are equal, round and reactive to light, intact accommodation, extraocular movements are intact, normal conjunctiva, vision grossly normal, Cornea: Clear, no abrasions, not cloudy, no corneal opacification, Retina: Retinal arteries no occlusion, retinal veins no venous pulsations, without retinal hemorrhage, Optic nerve: no disc edema, no papilledema.    Cardiovascular:  Normal peripheral perfusion, No edema.    Respiratory:  Respirations are  non-labored.   Back:  Normal range of motion.   Musculoskeletal:  Normal strength, no deformity.    Neurological:  Alert and oriented to person, place, time, and situation, No focal neurological deficit observed.    Psychiatric:  Cooperative.      Medical Decision Making   Rationale:  Poke with Dr. Dareen Piano, neuroradiologist and he is agreed with doing fluoroscopic LP due to her size and repetitive LPs.   Results review:  Lab results : Lab View   11/08/2019 12:57 EDT Protein CSF 19.3 mg/dL     Glucose CSF 60 mg/dL     CSF Culture with Gram Stain See Result (In Progress)   11/08/2019 11:16 EDT Estimated Creatinine Clearance 183.73 mL/min    , Interpretation Labs unremarkable, CSF white count is normal..       Reexamination/ Reevaluation   Time: 11/08/2019 13:56:00 .   Vital signs   Basic Oxygen Information   11/08/2019 11:13 EDT     SpO2                      99 %     Notes: She is feeling much better.  Headache is gone, vision is clear.  She can follow-up with her just.  Awaiting the rest of her CSF results, expected to be normal.      Impression and Plan   Diagnosis   Pseudotumor cerebri (ICD10-CM G93.2, Discharge, Medical)   Headache (ICD10-CM R51.9, Discharge, Medical)   Plan   Condition: Improved.    Disposition: Discharged: to home.    Patient was given the following educational materials: Pseudotumor Cerebri, Lumbar Puncture, Care After.    Follow up with: Follow up with your neurologists this week Within 1 week.    Counseled: Patient, Family, Regarding diagnostic results, Regarding treatment plan.    Signature Line     Electronically Signed on 11/08/2019 02:33 PM EDT   ________________________________________________   Vangie Bicker               Modified by: Vangie Bicker on 11/08/2019 02:00 PM EDT  Modified by: Vangie Bicker on 11/08/2019 02:33 PM EDT

## 2019-11-08 NOTE — ED Notes (Signed)
ED Triage Note       ED Secondary Triage Entered On:  11/08/2019 11:30 EDT    Performed On:  11/08/2019 11:30 EDT by De Blanch               General Information   Barriers to Learning :   None evident   Languages :   English   ED Home Meds Section :   Document assessment   [facility] ED Fall Risk Section :   Document assessment   ED Advance Directives Section :   Document assessment   ED Palliative Screen :   N/A (prefilled for <65yo)   De Blanch - 11/08/2019 11:30 EDT   (As Of: 11/08/2019 11:30:35 EDT)   Problems(Active)    Anxiety and depression (SNOMED CT  :[id number] )  Name of Problem:   Anxiety and depression ; Recorder:   Lady Gary, RN, [name]; Confirmation:   Confirmed ; Classification:   Patient Stated ; Code:   [phone] ; Contributor System:   PowerChart ; Last Updated:   10/09/2017 9:08 EDT ; Life Cycle Date:   10/09/2017 ; Life Cycle Status:   Active ; Vocabulary:   SNOMED CT        Blurred vision (SNOMED CT  :[id number] )  Name of Problem:   Blurred vision ; Recorder:   Kenard Gower RN, Cora Collum; Confirmation:   Confirmed ; Classification:   Patient Stated ; Code:   [id number] ; Contributor System:   Dietitian ; Last Updated:   05/20/2019 13:23 EDT ; Life Cycle Date:   05/20/2019 ; Life Cycle Status:   Active ; Vocabulary:   SNOMED CT        History of COVID-19 (SNOMED CT  :[id number] )  Name of Problem:   History of COVID-19 ; Onset Date:   07/2018 ; Recorder:   STOREN, RN, Cora Collum; Confirmation:   Confirmed ; Classification:   Patient Stated ; Code:   [id number] ; Contributor System:   PowerChart ; Last Updated:   05/20/2019 13:12 EDT ; Life Cycle Status:   Active ; Vocabulary:   SNOMED CT        Intracranial hypertension (SNOMED CT  :323557322 )  Name of Problem:   Intracranial hypertension ; Recorder:   LUTZ, RN, [name]; Confirmation:   Confirmed ; Classification:   Patient Stated ; Code:   [phone] ; Contributor System:   Dietitian ; Last Updated:   06/29/2018 15:20 EDT ; Life Cycle Date:    06/29/2018 ; Life Cycle Status:   Active ; Vocabulary:   SNOMED CT        Migraine (SNOMED CT  :37628315 )  Name of Problem:   Migraine ; Recorder:   Earl Lites, RN, Vista Lawman; Confirmation:   Confirmed ; Classification:   Patient Stated ; Code:   [id number] ; Contributor System:   PowerChart ; Last Updated:   06/14/2018 14:33 EDT ; Life Cycle Date:   06/14/2018 ; Life Cycle Status:   Active ; Vocabulary:   SNOMED CT        Seasonal allergies (SNOMED CT  :[phone] )  Name of Problem:   Seasonal allergies ; Recorder:   MCBRIDE, RN, ERIN R; Confirmation:   Confirmed ; Classification:   Patient Stated ; Code:   [phone] ; Contributor System:   PowerChart ; Last Updated:   01/19/2015 9:39 EST ; Life Cycle Date:   01/19/2015 ; Life Cycle Status:   Active ; Vocabulary:   SNOMED  CT        Wears glasses (SNOMED CT  :295284132 )  Name of Problem:   Wears glasses ; Recorder:   LUTZ, RN, [name]; Confirmation:   Confirmed ; Classification:   Patient Stated ; Code:   [phone] ; Contributor System:   Dietitian ; Last Updated:   06/29/2018 15:20 EDT ; Life Cycle Date:   06/29/2018 ; Life Cycle Status:   Active ; Vocabulary:   SNOMED CT          Diagnoses(Active)    Headache  Date:   11/08/2019 ; Diagnosis Type:   Reason For Visit ; Confirmation:   Complaint of ; Clinical Dx:   Headache ; Classification:   Medical ; Clinical Service:   Emergency medicine ; Code:   PNED ; Probability:   0 ; Diagnosis Code:   [id number]             -    Procedure History   (As Of: 11/08/2019 11:30:35 EDT)     Procedure Dt/Tm:   01/22/2014 ; Anesthesia Minutes:   0 ; Procedure Name:   Extraction of wisdom tooth ; Procedure Minutes:   0            Procedure Dt/Tm:   01/13/2013 ; Anesthesia Minutes:   0 ; Procedure Name:   Arthroscopy ; Procedure Minutes:   0 ; Comments:     01/19/2015 9:40 EST - MCBRIDE, RN, ERIN R  RIGHT KNEE            Procedure Dt/Tm:   01/19/2015 13:19:00 EST ; Location:    [facility] OR ; Provider:   LAMB-MD,  JOSHUA H; Anesthesia Type:   General ; :   FERLA-MD,  BRIAN P; Anesthesia Minutes:   0 ; Procedure Name:   Ankle Achilles Tendon Repair (Ankle, Left) ; Procedure Minutes:   25 ; Comments:     01/19/2015 14:45 EST - ZEIF, RN, REBECCA N  auto-populated from documented surgical case ; Clinical Service:   Surgery            Procedure Dt/Tm:   02/12/2015 16:52:00 EST ; Location:   [facility] OR ; Provider:   LAMB-MD,  JOSHUA H; Anesthesia Type:   General ; :   FERLA-MD,  BRIAN P; Anesthesia Minutes:   0 ; Procedure Name:   Ankle Achilles Tendon Repair (Left) ; Procedure Minutes:   52 ; Comments:     02/12/2015 18:26 EST - Anner Crete, RN, RON D  auto-populated from documented surgical case ; Clinical Service:   Surgery            Procedure Dt/Tm:   02/12/2015 16:52:00 EST ; Location:   [facility] OR ; Provider:   LAMB-MD,  JOSHUA H; Anesthesia Type:   General ; :   FERLA-MD,  BRIAN P; Anesthesia Minutes:   0 ; Procedure Name:   Ankle and/or Foot Incision and Drainage (Left) ; Procedure Minutes:   40 ; Comments:     02/12/2015 18:26 EST - Anner Crete, RN, RON D  auto-populated from documented surgical case ; Clinical Service:   Surgery            Procedure Dt/Tm:   03/04/2015 13:36:00 EST ; Location:   [facility] OR ; Provider:   Sibyl Parr; Anesthesia Type:   General ; :   Jenetta Loges; Anesthesia Minutes:   0 ; Procedure Name:   Ankle and/or Foot Incision and Drainage ; Procedure Minutes:   1502 ;  Comments:     03/04/2015 15:04 EST - DANIEL, RN, MELISSA S  auto-populated from documented surgical case ; Clinical Service:   Surgery            Procedure Dt/Tm:   07/26/2017 15:26:00 EDT ; Location:   [facility] OR ; Provider:   Marcine Matar; Anesthesia Type:   General ; :   PENDARVIS-MD, JR, ALLEN E; Anesthesia Minutes:   0 ; Procedure Name:   Ankle Achilles Tendon Repair (Right) ; Procedure Minutes:   31 ; Comments:     07/26/2017 16:05 EDT - Stacie Acres, RN, Elizabeth  auto-populated from documented surgical case ; Clinical  Service:   Surgery            Procedure Dt/Tm:   10/13/2017 12:32:00 EDT ; Location:   [facility] OR ; Provider:   Marcine Matar; Anesthesia Type:   General ; :   CHOI-MD,  YOUNG; Anesthesia Minutes:   0 ; Procedure Name:   Ankle and/or Foot Incision and Drainage (Right) ; Procedure Minutes:   20 ; Comments:     10/13/2017 13:00 EDT - ABELS, RN, PENNE L  auto-populated from documented surgical case ; Clinical Service:   Surgery            Procedure Dt/Tm:   03/28/2019 ; Anesthesia Minutes:   0 ; Procedure Name:   LUMBAR PUNCTURE ; Procedure Minutes:   0 ; Comments:     03/28/2019 9:05 EDT - Luciana Axe, RN, GRACE T  recurrent q41month as of 2020            [facility] Fall Risk Assessment Tool   Hx of falling last 3 months ED Fall :   No   Patient confused or disoriented ED Fall :   No   Patient intoxicated or sedated ED Fall :   No   Patient impaired gait ED Fall :   No   Use a mobility assistance device ED Fall :   No   Patient altered elimination ED Fall :   No   [facility] ED Fall Score :   0    De Blanch - 11/08/2019 11:30 EDT   ED Advance Directive   Advance Directive :   No   De Blanch - 11/08/2019 11:30 EDT

## 2019-11-08 NOTE — ED Notes (Signed)
ED Patient Education Note     ;Patient Education Materials Follows:  Neurology          Pseudotumor Cerebri  Pseudotumor cerebri, also called idiopathic intracranial hypertension, is a condition that occurs due to increased pressure within your skull.  Although some of the symptoms resemble those of a brain tumor, it is not a brain tumor. Symptoms occur when the increased pressure in your skull compresses brain structures. For example, pressure on the nerve responsible for vision (optic nerve) causes it to swell, resulting in visual symptoms.  Pseudotumor cerebri tends to occur in obese women younger than 22 years of age. However, men and children can also develop this condition.  SYMPTOMS  Symptoms of pseudotumor cerebri occur due to increased pressure within the skull. Symptoms may include:   ??? Headaches.  ??? Nausea and vomiting.   ??? Dizziness.  ??? High blood pressure.    ??? Ringing in the ears.  ??? Double or blurred vision.  ??? Brief episodes of complete loss of vision.   ??? Pain in the back, neck, or shoulders.  DIAGNOSIS  Pseudotumor cerebri is diagnosed through:  ??? A detailed eye exam, which can reveal a swollen optic nerve, as well as identifying issues such as blind spots in the vision.     ??? A spinal tap (lumbar puncture), which can demonstrate increased pressure within the skull.  TREATMENT  There are several ways that pseudotumor cerebri is treated, including:  ??? Medicines to decrease the production of spinal fluid and lower the pressure within your skull.  ??? Medicines to prevent or treat headaches.   ??? Surgery to create an opening in your optic nerve to allow excess fluid to drain out.  ??? Surgery to place drains (shunts) in your brain to remove excess fluid.  HOME CARE INSTRUCTIONS  ??? Take all medicines as directed by your health care provider.  ??? Go to all of your follow-up appointments.   ??? Lose weight if you are overweight.  SEEK MEDICAL CARE IF:  ??? Any symptoms come back.   ??? You develop trouble with  hearing, vision, balance, or your sense of smell.   ??? You cannot eat or drink what you need.  ??? You are more weak or tired than usual.    ??? You are losing weight without trying.  SEEK IMMEDIATE MEDICAL CARE IF:  ??? You have new symptoms such as vision problems or difficulty walking.    ??? You have a seizure.    ??? You have trouble breathing.    ??? You have a fever.      This information is not intended to replace advice given to you by your health care provider. Make sure you discuss any questions you have with your health care provider.  Document Released: 01/04/2013 Document Reviewed: 01/04/2013  Elsevier Interactive Patient Education ??2016 [employer] Inc.      Procedures     Lumbar Puncture, Care After    Refer to this sheet in the next few weeks. These instructions provide you with information on caring for yourself after your procedure. Your health care provider may also give you more specific instructions. Your treatment has been planned according to current medical practices, but problems sometimes occur. Call your health care provider if you have any problems or questions after your procedure.    WHAT TO EXPECT AFTER THE PROCEDURE    After your procedure, it is typical to have the following sensations:     Mild  discomfort or pain at the insertion site.     Mild headache that is relieved with pain medicines.    HOME CARE INSTRUCTIONS     Avoid lifting anything heavier than 10 lb (4.5 kg) for at least 12 hours after the procedure.     Drink enough fluids to keep your urine clear or pale yellow.     SEEK MEDICAL CARE IF:     You have fever or chills.     You have nausea or vomiting.     You have a headache that lasts for more than 2 days.    SEEK IMMEDIATE MEDICAL CARE IF:     You have any numbness or tingling in your legs.     You are unable to control your bowel or bladder.      You have bleeding or swelling in your back at the insertion site.     You are dizzy or faint.    This information is not intended to  replace advice given to you by your health care provider. Make sure you discuss any questions you have with your health care provider.    Document Released: 01/04/2013 Document Reviewed: 01/04/2013  Elsevier Interactive Patient Education ?2016 Elsevier Inc.

## 2019-11-08 NOTE — ED Notes (Signed)
 ED Patient Education Note     ;Patient Education Materials Follows:  Neurology          Pseudotumor Cerebri  Pseudotumor cerebri, also called idiopathic intracranial hypertension, is a condition that occurs due to increased pressure within your skull.  Although some of the symptoms resemble those of a brain tumor, it is not a brain tumor. Symptoms occur when the increased pressure in your skull compresses brain structures. For example, pressure on the nerve responsible for vision (optic nerve) causes it to swell, resulting in visual symptoms.  Pseudotumor cerebri tends to occur in obese women younger than 22 years of age. However, men and children can also develop this condition.  SYMPTOMS  Symptoms of pseudotumor cerebri occur due to increased pressure within the skull. Symptoms may include:   . Headaches.  . Nausea and vomiting.   . Dizziness.  . High blood pressure.    . Ringing in the ears.  . Double or blurred vision.  . Brief episodes of complete loss of vision.   . Pain in the back, neck, or shoulders.  DIAGNOSIS  Pseudotumor cerebri is diagnosed through:  SABRA A detailed eye exam, which can reveal a swollen optic nerve, as well as identifying issues such as blind spots in the vision.     SABRA A spinal tap (lumbar puncture), which can demonstrate increased pressure within the skull.  TREATMENT  There are several ways that pseudotumor cerebri is treated, including:  . Medicines to decrease the production of spinal fluid and lower the pressure within your skull.  . Medicines to prevent or treat headaches.   . Surgery to create an opening in your optic nerve to allow excess fluid to drain out.  . Surgery to place drains (shunts) in your brain to remove excess fluid.  HOME CARE INSTRUCTIONS  . Take all medicines as directed by your health care provider.  . Go to all of your follow-up appointments.   . Lose weight if you are overweight.  SEEK MEDICAL CARE IF:  . Any symptoms come back.   . You develop trouble with  hearing, vision, balance, or your sense of smell.   . You cannot eat or drink what you need.  . You are more weak or tired than usual.    . You are losing weight without trying.  SEEK IMMEDIATE MEDICAL CARE IF:  . You have new symptoms such as vision problems or difficulty walking.    . You have a seizure.    . You have trouble breathing.    . You have a fever.      This information is not intended to replace advice given to you by your health care provider. Make sure you discuss any questions you have with your health care provider.  Document Released: 01/04/2013 Document Reviewed: 01/04/2013  Elsevier Interactive Patient Education 2016 [employer] Inc.      Procedures     Lumbar Puncture, Care After    Refer to this sheet in the next few weeks. These instructions provide you with information on caring for yourself after your procedure. Your health care provider may also give you more specific instructions. Your treatment has been planned according to current medical practices, but problems sometimes occur. Call your health care provider if you have any problems or questions after your procedure.    WHAT TO EXPECT AFTER THE PROCEDURE    After your procedure, it is typical to have the following sensations:     Mild  discomfort or pain at the insertion site.     Mild headache that is relieved with pain medicines.    HOME CARE INSTRUCTIONS     Avoid lifting anything heavier than 10 lb (4.5 kg) for at least 12 hours after the procedure.     Drink enough fluids to keep your urine clear or pale yellow.     SEEK MEDICAL CARE IF:     You have fever or chills.     You have nausea or vomiting.     You have a headache that lasts for more than 2 days.    SEEK IMMEDIATE MEDICAL CARE IF:     You have any numbness or tingling in your legs.     You are unable to control your bowel or bladder.      You have bleeding or swelling in your back at the insertion site.     You are dizzy or faint.    This information is not intended to  replace advice given to you by your health care provider. Make sure you discuss any questions you have with your health care provider.    Document Released: 01/04/2013 Document Reviewed: 01/04/2013  Elsevier Interactive Patient Education ?2016 Elsevier Inc.

## 2019-11-08 NOTE — Discharge Summary (Signed)
 ED Clinical Summary                        [facility]  [address]  [place], [place], [address]  [phone]           PERSON INFORMATION  Name: Carrie Shields, Carrie Shields Age:  24 Years DOB: [birth year]   Sex: Female Language: English PCP: ELYSA DOUGLAS NORLEEN LEOTA   Marital Status: Single Phone: [phone] Med Service: MED-Medicine   MRN: [id number] Acct# [id number] Arrival: 11/08/2019 11:00:00   Visit Reason: Headache; ABDOMINAL PAIN Acuity: 3 LOS: 000 03:46   Address:    [address] [place] [address]   Diagnosis:    Headache; Pseudotumor cerebri  Medications:          Medications that have not changed  Other Medications  acetaZOLAMIDE (acetaZOLAMIDE 500 mg oral capsule, extended release) 2 Capsules Oral (given by mouth) 2 times a day.  Last Dose:____________________  diazepam (Valium 10 mg oral tablet) 1 Tabs Oral (given by mouth) On Call for Surgery as needed other (see comment). pre-procedures.  Last Dose:____________________  FLUoxetine (FLUoxetine 20 mg oral capsule) 2 Capsules Oral (given by mouth) once a day (in the morning).  Last Dose:____________________  lamoTRIgine (lamoTRIgine 150 mg oral tablet) 1 Tabs Oral (given by mouth) once a day (in the morning).  Last Dose:____________________  norethindrone-ethinyl estradiol (Junel Fe 1/20 oral tablet) 1 Tabs Oral (given by mouth) once a day (in the morning).  Last Dose:____________________      Medications Administered During Visit:                Medication Dose Route   lorazepam 1 mg Oral               Allergies      ertapenem (Hives / Itching)      Augmentin (Hives / Itching)      penicillin (Rash)      Major Tests and Procedures:  The following procedures and tests were performed during your ED visit.  COMMON PROCEDURES%>  COMMON PROCEDURES COMMENTS%>                PROVIDER INFORMATION               Provider Role Assigned Sampson BUCCO, [place] ED Provider 11/08/2019 11:01:27    Con Grice ED Nurse 11/08/2019  11:23:49        Attending Physician:  BUCCO ART      Admit Doc  BUCCO,  JOHN-MD     Consulting Doc       VITALS INFORMATION  Vital Sign Triage Latest   Temp Oral ORAL_1%> ORAL%>   Temp Temporal TEMPORAL_1%> TEMPORAL%>   Temp Intravascular INTRAVASCULAR_1%> INTRAVASCULAR%>   Temp Axillary AXILLARY_1%> AXILLARY%>   Temp Rectal RECTAL_1%> RECTAL%>   02 Sat 99 % 100 %   Respiratory Rate RATE_1%> RATE%>   Peripheral Pulse Rate PULSE RATE_1%> PULSE RATE%>   Apical Heart Rate HEART RATE_1%> HEART RATE%>   Blood Pressure BLOOD PRESSURE_1%>/ BLOOD PRESSURE_1%>84 mmHg BLOOD PRESSURE%> / BLOOD PRESSURE%>61 mmHg                 Immunizations      No Immunizations Documented This Visit          DISCHARGE INFORMATION   Discharge Disposition: H Outpt-Sent Home   Discharge Location:  Home   Discharge Date and Time:  11/08/2019 14:46:53   ED Checkout Date  and Time:  11/08/2019 14:46:53     DEPART REASON INCOMPLETE INFORMATION               Depart Action Incomplete Reason   Interactive View/I&O Recently assessed               Problems      No Problems Documented              Smoking Status      Never smoker         PATIENT EDUCATION INFORMATION  Instructions:     Lumbar Puncture, Care After; Pseudotumor Cerebri     Follow up:                   With: Address: When:   follow up with your neurologists this week  Within 1 week              ED PROVIDER DOCUMENTATION     Patient:   Carrie Shields, Carrie Shields            MRN: [id number]            FIN: [id number]               Age:   24 years     Sex:  Female     DOB:  [birth year]   Associated Diagnoses:   Pseudotumor cerebri; Headache   Author:   SMITTY ART      Basic Information   Additional information: Chief Complaint from Nursing Triage Note   Chief Complaint  Chief Complaint: pt co migraine last night with hx of same. states, neurologist told me to come in before my vision gets bad or anything. would like a LP (11/08/19 11:13:00).      History of Present Illness   The patient  presents with This is a 24 year old female with a history of pseudotumor cerebri who has had a headache and some blurry vision since last night.  She vomited last night.  She called her neurologist today and he told her go to the ER for LP.  She denies any fevers or chills.  She has a little bit of nausea but the vomiting is cleared.  Her blurry vision is mild.  Her headache is improved a little bit.  But is bilateral.  She also states she has some ear fullness and she states this occurs when she gets high cerebral pressures..        Review of Systems   Constitutional symptoms:  No fever, no chills.    Eye symptoms:  Blurred vision, No diplopia,    ENMT symptoms:  No ear pain, no sore throat.    Respiratory symptoms:  No shortness of breath, no cough.    Cardiovascular symptoms:  No chest pain, no palpitations.    Gastrointestinal symptoms:  No vomiting, no diarrhea.    Genitourinary symptoms:  No dysuria, no hematuria.    Neurologic symptoms:  Headache, no numbness, no tingling, no focal weakness.              Additional review of systems information: All other systems reviewed and otherwise negative.      Health Status   Allergies:    Allergic Reactions (Selected)  Moderate  Penicillin- Rash.  Unknown  Augmentin- Hives / itching.  Ertapenem- Hives / itching..   Medications:  (Selected)   Documented Medications  Documented  FLUoxetine 20 mg oral capsule: 40 mg, 2 caps, Oral, qAM, 0 Refill(s)  Junel Fe 1/20 oral tablet: 1 tabs, Oral, qAM, 0 Refill(s)  Valium 10 mg oral tablet: 10 mg, 1 tabs, Oral, On Call, pre-procedures, PRN: other (see comment), 0 Refill(s)  acetaZOLAMIDE 500 mg oral capsule, extended release: 1,000 mg, 2 caps, Oral, BID, 30 cap, 0 Refill(s)  lamoTRIgine 150 mg oral tablet: 150 mg, 1 tabs, Oral, qAM, 0 Refill(s).      Past Medical/ Family/ Social History   Medical history: Reviewed as documented in chart.   Surgical history: Reviewed as documented in chart.   Family history: Not significant.    Social history: Reviewed as documented in chart.   Problem list:    Active Problems (7)  Anxiety and depression   Blurred vision   History of COVID-19   Intracranial hypertension   Migraine   Seasonal allergies   Wears glasses   .      Physical Examination               Vital Signs   Vital Signs   11/08/2019 11:13 EDT Systolic Blood Pressure 119 mmHg    Diastolic Blood Pressure 84 mmHg    Temperature Oral 37 degC    Heart Rate Monitored 59 bpm  LOW    Respiratory Rate 15 br/min    SpO2 99 %   .   Measurements   11/08/2019 11:16 EDT Body Mass Index est meas 36.13 kg/m2    Body Mass Index Measured 36.13 kg/m2   11/08/2019 11:13 EDT Height/Length Measured 183 cm    Weight Dosing 121 kg   .   Basic Oxygen Information   11/08/2019 11:13 EDT     SpO2                      99 %  .   General:  Alert, no acute distress.    Skin:  Warm, dry, no rash.    Head:  Atraumatic.   Neck:  Trachea midline.   Eye:  Pupils are equal, round and reactive to light, intact accommodation, extraocular movements are intact, normal conjunctiva, vision grossly normal, Cornea: Clear, no abrasions, not cloudy, no corneal opacification, Retina: Retinal arteries no occlusion, retinal veins no venous pulsations, without retinal hemorrhage, Optic nerve: no disc edema, no papilledema.    Cardiovascular:  Normal peripheral perfusion, No edema.    Respiratory:  Respirations are non-labored.   Back:  Normal range of motion.   Musculoskeletal:  Normal strength, no deformity.    Neurological:  Alert and oriented to person, place, time, and situation, No focal neurological deficit observed.    Psychiatric:  Cooperative.      Medical Decision Making   Rationale:  Poke with Dr. Lenon, neuroradiologist and he is agreed with doing fluoroscopic LP due to her size and repetitive LPs.   Results review:  Lab results : Lab View   11/08/2019 12:57 EDT Protein CSF 19.3 mg/dL     Glucose CSF 60 mg/dL     CSF Culture with Gram Stain See Result (In Progress)    11/08/2019 11:16 EDT Estimated Creatinine Clearance 183.73 mL/min    , Interpretation Labs unremarkable, CSF white count is normal..       Reexamination/ Reevaluation   Time: 11/08/2019 13:56:00 .   Vital signs   Basic Oxygen Information   11/08/2019 11:13 EDT     SpO2                      99 %  Notes: She is feeling much better.  Headache is gone, vision is clear.  She can follow-up with her just.  Awaiting the rest of her CSF results, expected to be normal.      Impression and Plan   Diagnosis   Pseudotumor cerebri (ICD10-CM G93.2, Discharge, Medical)   Headache (ICD10-CM R51.9, Discharge, Medical)   Plan   Condition: Improved.    Disposition: Discharged: to home.    Patient was given the following educational materials: Pseudotumor Cerebri, Lumbar Puncture, Care After.    Follow up with: Follow up with your neurologists this week Within 1 week.    Counseled: Patient, Family, Regarding diagnostic results, Regarding treatment plan.

## 2019-11-08 NOTE — ED Notes (Signed)
ED Patient Summary       ;       [facility] Emergency Department  [address] [place] [address]  [phone]  Discharge Instructions (Patient)  Name: Carrie Shields, Carrie Shields  DOB:  15-Mar-1995                   MRN: [id number]                   FIN: [id number]  Reason For Visit: Headache; ABDOMINAL PAIN  Final Diagnosis: Headache; Pseudotumor cerebri     Visit Date: 11/08/2019 11:00:00  Address: [address] [place] [address]  Phone: [phone]     Emergency Department Providers:         Primary Physician:      Arville Lime would like to thank you for allowing [place] to assist you with your healthcare needs. The following includes patient education materials and information regarding your injury/illness.     Follow-up Instructions:  You were seen today on an emergency basis. Please contact your primary care doctor for a follow up appointment. If you received a referral to a specialist doctor, it is important you follow-up as instructed.    It is important that you call your follow-up doctor to schedule and confirm the location of your next appointment. Your doctor may practice at multiple locations. The office location of your follow-up appointment may be different to the one written on your discharge instructions.    If you do not have a primary care doctor, please call [phone] for help in finding a [facility] Provider. For help in finding a specialist doctor, please call [phone].    If your condition gets worse before your follow-up with your primary care doctor or specialist, please return to the Emergency Department.      Coronavirus 2019 (COVID-19) Reminders:     Patients age 45 - 63, with parental consent, and patients over age 99 can make an appointment for a COVID-19 vaccine. Patients can contact their [facility] doctors' offices to schedule an appointment to receive the COVID-19 vaccine. Patients who  do not have a [facility] physician can call [phone] to schedule vaccination appointments.      Follow Up Appointments:  Primary Care Provider:     Name: Loanne Drilling     Phone: [phone]                 With: Address: When:   follow up with your neurologists this week  Within 1 week                   Medications that have not changed  Other Medications  acetaZOLAMIDE (acetaZOLAMIDE 500 mg oral capsule, extended release) 2 Capsules Oral (given by mouth) 2 times a day.  Last Dose:____________________  diazepam (Valium 10 mg oral tablet) 1 Tabs Oral (given by mouth) On Call for Surgery as needed other (see comment). pre-procedures.  Last Dose:____________________  FLUoxetine (FLUoxetine 20 mg oral capsule) 2 Capsules Oral (given by mouth) once a day (in the morning).  Last Dose:____________________  lamoTRIgine (lamoTRIgine 150 mg oral tablet) 1 Tabs Oral (given by mouth) once a day (in the morning).  Last Dose:____________________  norethindrone-ethinyl estradiol (Junel Fe 1/20 oral tablet) 1 Tabs Oral (given by mouth) once a day (in the morning).  Last Dose:____________________  Allergy Info: Augmentin; ertapenem; penicillin     Discharge Additional Information          Discharge Patient 11/08/19 14:33:00 EDT      Patient Education Materials:        Lumbar Puncture, Care After    Refer to this sheet in the next few weeks. These instructions provide you with information on caring for yourself after your procedure. Your health care provider may also give you more specific instructions. Your treatment has been planned according to current medical practices, but problems sometimes occur. Call your health care provider if you have any problems or questions after your procedure.    WHAT TO EXPECT AFTER THE PROCEDURE    After your procedure, it is typical to have the following sensations:     Mild discomfort or pain at the insertion site.     Mild headache that is relieved with pain  medicines.    HOME CARE INSTRUCTIONS     Avoid lifting anything heavier than 10 lb (4.5 kg) for at least 12 hours after the procedure.     Drink enough fluids to keep your urine clear or pale yellow.     SEEK MEDICAL CARE IF:     You have fever or chills.     You have nausea or vomiting.     You have a headache that lasts for more than 2 days.    SEEK IMMEDIATE MEDICAL CARE IF:     You have any numbness or tingling in your legs.     You are unable to control your bowel or bladder.      You have bleeding or swelling in your back at the insertion site.     You are dizzy or faint.    This information is not intended to replace advice given to you by your health care provider. Make sure you discuss any questions you have with your health care provider.    Document Released: 01/04/2013 Document Reviewed: 01/04/2013  Elsevier Interactive Patient Education ?2016 [employer]            Pseudotumor Cerebri  Pseudotumor cerebri, also called idiopathic intracranial hypertension, is a condition that occurs due to increased pressure within your skull.  Although some of the symptoms resemble those of a brain tumor, it is not a brain tumor. Symptoms occur when the increased pressure in your skull compresses brain structures. For example, pressure on the nerve responsible for vision (optic nerve) causes it to swell, resulting in visual symptoms.  Pseudotumor cerebri tends to occur in obese women younger than 22 years of age. However, men and children can also develop this condition.  SYMPTOMS  Symptoms of pseudotumor cerebri occur due to increased pressure within the skull. Symptoms may include:   . Headaches.  . Nausea and vomiting.   . Dizziness.  . High blood pressure.    . Ringing in the ears.  . Double or blurred vision.  . Brief episodes of complete loss of vision.   . Pain in the back, neck, or shoulders.  DIAGNOSIS  Pseudotumor cerebri is diagnosed through:  Marland Kitchen A detailed eye exam, which can reveal a swollen optic nerve,  as well as identifying issues such as blind spots in the vision.     Marland Kitchen A spinal tap (lumbar puncture), which can demonstrate increased pressure within the skull.  TREATMENT  There are several ways that pseudotumor cerebri is treated, including:  . Medicines to decrease the production of spinal fluid  and lower the pressure within your skull.  . Medicines to prevent or treat headaches.   . Surgery to create an opening in your optic nerve to allow excess fluid to drain out.  . Surgery to place drains (shunts) in your brain to remove excess fluid.  HOME CARE INSTRUCTIONS  . Take all medicines as directed by your health care provider.  . Go to all of your follow-up appointments.   . Lose weight if you are overweight.  SEEK MEDICAL CARE IF:  . Any symptoms come back.   . You develop trouble with hearing, vision, balance, or your sense of smell.   . You cannot eat or drink what you need.  . You are more weak or tired than usual.    . You are losing weight without trying.  SEEK IMMEDIATE MEDICAL CARE IF:  . You have new symptoms such as vision problems or difficulty walking.    . You have a seizure.    . You have trouble breathing.    . You have a fever.      This information is not intended to replace advice given to you by your health care provider. Make sure you discuss any questions you have with your health care provider.  Document Released: 01/04/2013 Document Reviewed: 01/04/2013  Elsevier Interactive Patient Education 2016 [employer].      ---------------------------------------------------------------------------------------------------------------------  [facility]) encourages you to self-enroll in the [facility] Patient Portal.  [facility] Patient Portal will allow you to manage your personal health information securely from your own electronic device now and in the future.  To begin your Patient Portal enrollment process, please visit [email or web address] Click on "Sign  up now" under [facility].  If you find that you need additional assistance on the [facility] Patient Portal or need a copy of your medical records, please call the [facility] Medical Records Office at [phone].  Comment:

## 2019-11-08 NOTE — ED Notes (Signed)
ED Triage Note       ED Triage Adult Entered On:  11/08/2019 11:16 EDT    Performed On:  11/08/2019 11:13 EDT by Bari Mantis, RN, TAMARA L               Triage   Numeric Rating Pain Scale :   5 = Moderate pain   Chief Complaint :   Carrie Shields co migraine last night with hx of same. states, "neurologist told me to come in before my vision gets bad or anything". would like a LP   [place] Mode of Arrival :   Walking   Infectious Disease Documentation :   Document assessment   Temperature Oral :   37 degC(Converted to: 98.6 degF)    Heart Rate Monitored :   59 bpm (LOW)    Respiratory Rate :   15 br/min   Systolic Blood Pressure :   119 mmHg   Diastolic Blood Pressure :   84 mmHg   SpO2 :   99 %   Patient presentation :   None of the above   Chief Complaint or Presentation suggest infection :   Yes   Weight Dosing :   121 kg(Converted to: 266 lb 12 oz)    Height :   183 cm(Converted to: 6 ft 0 in)    Body Mass Index Dosing :   36 kg/m2   MACBRIDE, RN, TAMARA L - 11/08/2019 11:13 EDT   DCP GENERIC CODE   Tracking Acuity :   3   Tracking Group :   ED [employer], RN, TAMARA L - 11/08/2019 11:13 EDT   ED General Section :   Document assessment   Pregnancy Status :   Patient denies   ED Allergies Section :   Document assessment   ED Reason for Visit Section :   Document assessment   ED Home Meds Section :   Document assessment   ED Quick Assessment :   Patient appears awake, alert, oriented to baseline. Skin warm and dry. Moves all extremities. Respiration even and unlabored. Appears in no apparent distress.   MACBRIDE, RN, TAMARA L - 11/08/2019 11:13 EDT   ID Risk Screen Symptoms   Recent Travel History :   No recent travel   Close Contact with COVID-19 ID :   No   Last 14 days COVID-19 ID :   No   MACBRIDE, RN, TAMARA L - 11/08/2019 11:13 EDT   Allergies   (As Of: 11/08/2019 11:16:28 EDT)   Allergies (Active)   Augmentin  Estimated Onset Date:   Unspecified ; Reactions:   Hives / Itching ; Created By:   Lady Gary,  RN, [name]; Reaction Status:   Active ; Category:   Drug ; Substance:   Augmentin ; Type:   Allergy ; Severity:   Unknown ; Updated By:   Lady Gary, RN, Charm Barges; Reviewed Date:   05/27/2019 9:59 EDT      ertapenem  Estimated Onset Date:   Unspecified ; Reactions:   Hives / Itching ; Created By:   Lady Gary, RN, [name]; Reaction Status:   Active ; Category:   Drug ; Substance:   ertapenem ; Type:   Allergy ; Severity:   Unknown ; Updated By:   Lady Gary RN, Charm Barges; Reviewed Date:   05/27/2019 9:59 EDT      penicillin  Estimated Onset Date:   Unspecified ; Reactions:   Rash ; Created By:  Jorge Mandril, RN, Burnell Blanks L; Reaction Status:   Active ; Category:   Drug ; Substance:   penicillin ; Type:   Allergy ; Severity:   Moderate ; Updated By:   Jorge Mandril, RN, Vista Lawman; Reviewed Date:   05/27/2019 9:59 EDT        Psycho-Social   Last 3 mo, thoughts killing self/others :   Patient denies   Right click within box for Suspected Abuse policy link. :   None   Feels Safe Where Live :   Yes   ED Behavioral Activity Rating Scale :   4 - Quiet and awake (normal level of activity)   MACBRIDE, RN, TAMARA L - 11/08/2019 11:13 EDT   ED Home Med List   Medication List   (As Of: 11/08/2019 11:16:28 EDT)   Home Meds    acetaZOLAMIDE  :   acetaZOLAMIDE ; Status:   Documented ; Ordered As Mnemonic:   acetaZOLAMIDE 500 mg oral capsule, extended release ; Simple Display Line:   1,000 mg, 2 caps, Oral, BID, 30 cap, 0 Refill(s) ; Catalog Code:   acetaZOLAMIDE ; Order Dt/Tm:   05/20/2019 13:18:18 EDT          diazepam  :   diazepam ; Status:   Documented ; Ordered As Mnemonic:   Valium 10 mg oral tablet ; Simple Display Line:   10 mg, 1 tabs, Oral, On Call, pre-procedures, PRN: other (see comment), 0 Refill(s) ; Catalog Code:   diazepam ; Order Dt/Tm:   03/28/2019 09:08:51 EDT          FLUoxetine  :   FLUoxetine ; Status:   Documented ; Ordered As Mnemonic:   FLUoxetine 20 mg oral capsule ; Simple Display Line:   40 mg, 2 caps, Oral, qAM, 0 Refill(s) ; Catalog  Code:   FLUoxetine ; Order Dt/Tm:   03/24/2019 11:59:17 EST          lamoTRIgine  :   lamoTRIgine ; Status:   Documented ; Ordered As Mnemonic:   lamoTRIgine 150 mg oral tablet ; Simple Display Line:   150 mg, 1 tabs, Oral, qAM, 0 Refill(s) ; Catalog Code:   lamoTRIgine ; Order Dt/Tm:   03/24/2019 11:59:17 EST          norethindrone-ethinyl estradiol  :   norethindrone-ethinyl estradiol ; Status:   Documented ; Ordered As Mnemonic:   Junel Fe 1/20 oral tablet ; Simple Display Line:   1 tabs, Oral, qAM, 0 Refill(s) ; Catalog Code:   norethindrone-ethinyl estradiol ; Order Dt/Tm:   03/24/2019 11:59:17 EST            ED Reason for Visit   (As Of: 11/08/2019 11:16:28 EDT)   Problems(Active)    Anxiety and depression (SNOMED CT  :16109604 )  Name of Problem:   Anxiety and depression ; Recorder:   Lady Gary, RN, [name]; Confirmation:   Confirmed ; Classification:   Patient Stated ; Code:   [id number] ; Contributor System:   PowerChart ; Last Updated:   10/09/2017 9:08 EDT ; Life Cycle Date:   10/09/2017 ; Life Cycle Status:   Active ; Vocabulary:   SNOMED CT        Blurred vision (SNOMED CT  :147829562 )  Name of Problem:   Blurred vision ; Recorder:   Kenard Gower RN, Cora Collum; Confirmation:   Confirmed ; Classification:   Patient Stated ; Code:   [id number] ; Contributor System:   Dietitian ; Last Updated:  05/20/2019 13:23 EDT ; Life Cycle Date:   05/20/2019 ; Life Cycle Status:   Active ; Vocabulary:   SNOMED CT        History of COVID-19 (SNOMED CT  :4540981191 )  Name of Problem:   History of COVID-19 ; Onset Date:   07/2018 ; Recorder:   STOREN, RN, Cora Collum; Confirmation:   Confirmed ; Classification:   Patient Stated ; Code:   [id number] ; Contributor System:   PowerChart ; Last Updated:   05/20/2019 13:12 EDT ; Life Cycle Status:   Active ; Vocabulary:   SNOMED CT        Intracranial hypertension (SNOMED CT  :086578469 )  Name of Problem:   Intracranial hypertension ; Recorder:   LUTZ, RN, [name]; Confirmation:   Confirmed ;  Classification:   Patient Stated ; Code:   [id number] ; Contributor System:   Dietitian ; Last Updated:   06/29/2018 15:20 EDT ; Life Cycle Date:   06/29/2018 ; Life Cycle Status:   Active ; Vocabulary:   SNOMED CT        Migraine (SNOMED CT  :24401027 )  Name of Problem:   Migraine ; Recorder:   Earl Lites, RN, Vista Lawman; Confirmation:   Confirmed ; Classification:   Patient Stated ; Code:   25366440 ; Contributor System:   PowerChart ; Last Updated:   06/14/2018 14:33 EDT ; Life Cycle Date:   06/14/2018 ; Life Cycle Status:   Active ; Vocabulary:   SNOMED CT        Seasonal allergies (SNOMED CT  :[phone] )  Name of Problem:   Seasonal allergies ; Recorder:   MCBRIDE, RN, ERIN R; Confirmation:   Confirmed ; Classification:   Patient Stated ; Code:   [phone] ; Contributor System:   PowerChart ; Last Updated:   01/19/2015 9:39 EST ; Life Cycle Date:   01/19/2015 ; Life Cycle Status:   Active ; Vocabulary:   SNOMED CT        Wears glasses (SNOMED CT  :761607371 )  Name of Problem:   Wears glasses ; Recorder:   LUTZ, RN, [name]; Confirmation:   Confirmed ; Classification:   Patient Stated ; Code:   062694854 ; Contributor System:   Dietitian ; Last Updated:   06/29/2018 15:20 EDT ; Life Cycle Date:   06/29/2018 ; Life Cycle Status:   Active ; Vocabulary:   SNOMED CT          Diagnoses(Active)    Headache  Date:   11/08/2019 ; Diagnosis Type:   Reason For Visit ; Confirmation:   Complaint of ; Clinical Dx:   Headache ; Classification:   Medical ; Clinical Service:   Emergency medicine ; Code:   PNED ; Probability:   0 ; Diagnosis Code:   [phone]

## 2019-11-12 LAB — CULTURE, CSF: FINAL REPORT: NO GROWTH

## 2020-02-06 LAB — POC COVID-19 (LIAT IN HOUSE): SARS-CoV-2: NOT DETECTED

## 2020-05-02 NOTE — Nursing Note (Signed)
Adult Admission Assessment - Text       Perioperative Admission Assessment Entered On:  05/02/2020 12:42 EDT    Performed On:  05/02/2020 12:41 EDT by Josephine Cables, RN, Maurice March               General   Call Complete :   05/02/2020 14:01 EDT   Height/Length Estimated :   182.9 cm(Converted to: 72.01 in)    Weight   Estimated :   104 kg(Converted to: 229.281 lb)    Body Mass Index Estimated :   31.09 kg/m2   Day of Proc Supp Prsn is the Emerg Cont :   Yes   Ola Spurr - 05/02/2020 13:57 EDT   Day of Procedure Support Person Name :   Anthony Sar, RNMordecai Maes - 05/07/2020 7:39 EDT     Day of Procedure Support Person Phone :   [phone]   Day of Procedure Support Person Relationship :   mother   Ola Spurr - 05/02/2020 13:57 EDT   Call Start :   05/02/2020 12:41 EDT   Information Given By :   Ardelia Mems to obtain   Reason Information Not Obtained :   04/20/lm/jh   PAT Patient Procedure Verification :   Patient name and DOB confirmed with patient, Correct procedure scheduled confirmed with patient, Correct side/site confirmed with patient   Primary Care Physician/Specialists :   DR. Cyril Mourning, PCP  DR. THOMAS HUGHES-NEURO   Languages :   Janet Berlin, RNMaurice March - 05/02/2020 12:41 EDT   Allergies   (As Of: 05/07/2020 07:38:29 EDT)   Allergies (Active)   Augmentin  Estimated Onset Date:   Unspecified ; Reactions:   Hives / Itching ; Created By:   Lady Gary, RN, [name]; Reaction Status:   Active ; Category:   Drug ; Substance:   Augmentin ; Type:   Allergy ; Severity:   Unknown ; Updated By:   Lady Gary, RN, Charm Barges; Reviewed Date:   05/02/2020 13:57 EDT      ertapenem  Estimated Onset Date:   Unspecified ; Reactions:   Hives / Itching ; Created By:   Lady Gary, RN, [name]; Reaction Status:   Active ; Category:   Drug ; Substance:   ertapenem ; Type:   Allergy ; Severity:   Unknown ; Updated By:   Lady Gary RN, Charm Barges; Reviewed Date:   05/02/2020 13:57 EDT      penicillin  Estimated Onset Date:   Unspecified ;  Reactions:   Rash ; Created By:   Jorge Mandril, RN, [name]; Reaction Status:   Active ; Category:   Drug ; Substance:   penicillin ; Type:   Allergy ; Severity:   Moderate ; Updated By:   Jorge Mandril, RN, Vista Lawman; Reviewed Date:   05/02/2020 13:57 EDT        Medication History   Medication List   (As Of: 05/07/2020 07:38:29 EDT)   Home Meds    acetaZOLAMIDE  :   acetaZOLAMIDE ; Status:   Documented ; Ordered As Mnemonic:   acetaZOLAMIDE 500 mg oral capsule, extended release ; Simple Display Line:   1,000 mg, 2 caps, Oral, BID, 30 cap, 0 Refill(s) ; Catalog Code:   acetaZOLAMIDE ; Order Dt/Tm:   05/20/2019 13:18:18 EDT          diazepam  :   diazepam ; Status:   Documented ; Ordered As Mnemonic:   Valium 10  mg oral tablet ; Simple Display Line:   10 mg, 1 tabs, Oral, On Call, pre-procedures, PRN: other (see comment), 0 Refill(s) ; Catalog Code:   diazepam ; Order Dt/Tm:   03/28/2019 09:08:51 EDT          FLUoxetine  :   FLUoxetine ; Status:   Documented ; Ordered As Mnemonic:   FLUoxetine 20 mg oral capsule ; Simple Display Line:   40 mg, 2 caps, Oral, qAM, 0 Refill(s) ; Catalog Code:   FLUoxetine ; Order Dt/Tm:   03/24/2019 11:59:17 EST          lamoTRIgine  :   lamoTRIgine ; Status:   Documented ; Ordered As Mnemonic:   lamoTRIgine 150 mg oral tablet ; Simple Display Line:   150 mg, 1 tabs, Oral, qAM, 0 Refill(s) ; Catalog Code:   lamoTRIgine ; Order Dt/Tm:   03/24/2019 11:59:17 EST          norethindrone-ethinyl estradiol  :   norethindrone-ethinyl estradiol ; Status:   Documented ; Ordered As Mnemonic:   Junel Fe 1/20 oral tablet ; Simple Display Line:   1 tabs, Oral, qAM, 0 Refill(s) ; Catalog Code:   norethindrone-ethinyl estradiol ; Order Dt/Tm:   03/24/2019 11:59:17 EST          topiramate  :   topiramate ; Status:   Documented ; Ordered As Mnemonic:   topiramate 25 mg oral tablet ; Simple Display Line:   50 mg, 2 tabs, Oral, Daily, 180 tabs, 0 Refill(s) ; Catalog Code:   topiramate ; Order Dt/Tm:   05/02/2020 13:59:14 EDT  ; Comment:   SOUND ALIKE / LOOK ALIKE - VERIFY DRUG            Problem History   (As Of: 05/07/2020 07:38:29 EDT)   Problems(Active)    Anxiety and depression (SNOMED CT  :99357017 )  Name of Problem:   Anxiety and depression ; Recorder:   Lady Gary, RN, [name]; Confirmation:   Confirmed ; Classification:   Patient Stated ; Code:   79390300 ; Contributor System:   PowerChart ; Last Updated:   10/09/2017 9:08 EDT ; Life Cycle Date:   10/09/2017 ; Life Cycle Status:   Active ; Vocabulary:   SNOMED CT        Blurred vision (SNOMED CT  :923300762 )  Name of Problem:   Blurred vision ; Recorder:   Kenard Gower RN, Cora Collum; Confirmation:   Confirmed ; Classification:   Patient Stated ; Code:   263335456 ; Contributor System:   Dietitian ; Last Updated:   05/20/2019 13:23 EDT ; Life Cycle Date:   05/20/2019 ; Life Cycle Status:   Active ; Vocabulary:   SNOMED CT        History of COVID-19 (SNOMED CT  :2563893734 )  Name of Problem:   History of COVID-19 ; Onset Date:   07/2018 ; Recorder:   STOREN, RN, Cora Collum; Confirmation:   Confirmed ; Classification:   Patient Stated ; Code:   2876811572 ; Contributor System:   PowerChart ; Last Updated:   05/20/2019 13:12 EDT ; Life Cycle Status:   Active ; Vocabulary:   SNOMED CT        Intracranial hypertension (SNOMED CT  :620355974 )  Name of Problem:   Intracranial hypertension ; Recorder:   LUTZ, RN, [name]; Confirmation:   Confirmed ; Classification:   Patient Stated ; Code:   163845364 ; Contributor System:   Dietitian ;  Last Updated:   06/29/2018 15:20 EDT ; Life Cycle Date:   06/29/2018 ; Life Cycle Status:   Active ; Vocabulary:   SNOMED CT        Migraine (SNOMED CT  :[id number] )  Name of Problem:   Migraine ; Recorder:   Earl LitesGregory, RN, Vista LawmanKrystle L; Confirmation:   Confirmed ; Classification:   Patient Stated ; Code:   [id number] ; Contributor System:   PowerChart ; Last Updated:   06/14/2018 14:33 EDT ; Life Cycle Date:   06/14/2018 ; Life Cycle Status:   Active ; Vocabulary:   SNOMED CT         Seasonal allergies (SNOMED CT  [id number] )  Name of Problem:   Seasonal allergies ; Recorder:   MCBRIDE, RN, ERIN R; Confirmation:   Confirmed ; Classification:   Patient Stated ; Code:   [phone] ; Contributor System:   PowerChart ; Last Updated:   01/19/2015 9:39 EST ; Life Cycle Date:   01/19/2015 ; Life Cycle Status:   Active ; Vocabulary:   SNOMED CT        Wears glasses (SNOMED CT  :[id number] )  Name of Problem:   Wears glasses ; Recorder:   LUTZ, RN, [name]; Confirmation:   Confirmed ; Classification:   Patient Stated ; Code:   [id number] ; Contributor System:   DietitianowerChart ; Last Updated:   06/29/2018 15:20 EDT ; Life Cycle Date:   06/29/2018 ; Life Cycle Status:   Active ; Vocabulary:   SNOMED CT          Procedure History        -    Procedure History   (As Of: 05/07/2020 07:38:29 EDT)     Procedure Dt/Tm:   01/22/2014 ; Anesthesia Minutes:   0 ; Procedure Name:   Extraction of wisdom tooth ; Procedure Minutes:   0            Procedure Dt/Tm:   01/13/2013 ; Anesthesia Minutes:   0 ; Procedure Name:   Arthroscopy ; Procedure Minutes:   0 ; Comments:     01/19/2015 9:40 EST - MCBRIDE, RN, ERIN R  RIGHT KNEE            Procedure Dt/Tm:   01/19/2015 13:19:00 EST ; Location:   [facility] ; Provider:   LAMB-MD,  JOSHUA H; Anesthesia Type:   General ; :   FERLA-MD,  BRIAN P; Anesthesia Minutes:   0 ; Procedure Name:   Ankle Achilles Tendon Repair (Ankle, Left) ; Procedure Minutes:   9674 ; Comments:     01/19/2015 14:45 EST - ZEIF, RN, REBECCA N  auto-populated from documented surgical case ; Clinical Service:   Surgery            Procedure Dt/Tm:   02/12/2015 16:52:00 EST ; Location:   [facility] ; Provider:   LAMB-MD,  JOSHUA H; Anesthesia Type:   General ; :   FERLA-MD,  BRIAN P; Anesthesia Minutes:   0 ; Procedure Name:   Ankle Achilles Tendon Repair (Left) ; Procedure Minutes:   5381 ; Comments:     02/12/2015 18:26 EST - Anner CreteWELLS, RN, RON D  auto-populated from documented surgical case  ; Clinical Service:   Surgery            Procedure Dt/Tm:   02/12/2015 16:52:00 EST ; Location:   [facility] ; Provider:   LAMB-MD,  JOSHUA H; Anesthesia Type:   General ; :  FERLA-MD,  BRIAN P; Anesthesia Minutes:   0 ; Procedure Name:   Ankle and/or Foot Incision and Drainage (Left) ; Procedure Minutes:   46 ; Comments:     02/12/2015 18:26 EST - Anner Crete, RN, RON D  auto-populated from documented surgical case ; Clinical Service:   Surgery            Procedure Dt/Tm:   03/04/2015 13:36:00 EST ; Location:   [facility] ; Provider:   Sibyl Parr; Anesthesia Type:   General ; :   Jenetta Loges; Anesthesia Minutes:   0 ; Procedure Name:   Ankle and/or Foot Incision and Drainage ; Procedure Minutes:   1502 ; Comments:     03/04/2015 15:04 EST - DANIEL, RN, MELISSA S  auto-populated from documented surgical case ; Clinical Service:   Surgery            Procedure Dt/Tm:   07/26/2017 15:26:00 EDT ; Location:   [facility] ; Provider:   Marcine Matar; Anesthesia Type:   General ; :   PENDARVIS-MD, JR, ALLEN E; Anesthesia Minutes:   0 ; Procedure Name:   Ankle Achilles Tendon Repair (Right) ; Procedure Minutes:   31 ; Comments:     07/26/2017 16:05 EDT - Stacie Acres, RN, Elizabeth  auto-populated from documented surgical case ; Clinical Service:   Surgery            Procedure Dt/Tm:   10/13/2017 12:32:00 EDT ; Location:   [facility] ; Provider:   Marcine Matar; Anesthesia Type:   General ; :   CHOI-MD,  YOUNG; Anesthesia Minutes:   0 ; Procedure Name:   Ankle and/or Foot Incision and Drainage (Right) ; Procedure Minutes:   20 ; Comments:     10/13/2017 13:00 EDT - ABELS, RN, PENNE L  auto-populated from documented surgical case ; Clinical Service:   Surgery            Procedure Dt/Tm:   03/28/2019 ; Anesthesia Minutes:   0 ; Procedure Name:   LUMBAR PUNCTURE ; Procedure Minutes:   0 ; Comments:     03/28/2019 9:05 EDT - Luciana Axe, RN, GRACE T  recurrent q74month as of 2020            Anesthesia/Sedation   Anesthesia History :   Prior general  anesthesia   SN - Malignant Hyperthermia :   Denies   Moderate Sedation History :   Prior sedation for procedure   Previous Problem With Sedation :   None   Symptoms of Sleep Apnea :   BMI greater than 35   Symptoms of Sleep Apnea Score (STOP BANG) :   1    Shortness of Breath Indicator :   No shortness of breath   Ola Spurr - 05/02/2020 13:57 EDT   Bloodless Medicine   Is Blood Transfusion Acceptable to Patient :   Douglass Rivers, RN, Maurice March - 05/02/2020 13:57 EDT   ID Risk Screen Symptoms   Recent Travel History :   No recent travel   TB Symptom Screen :   No symptoms   Last 90 days COVID-19 ID :   No   Close Contact with COVID-19 ID :   No   Last 14 days COVID-19 ID :   No   C. diff Symptom/History ID :   Neither of the above   Ola Spurr - 05/02/2020 13:57 EDT   Social  History   Social History   (As Of: 05/07/2020 07:38:29 EDT)   Tobacco:        Never smoker   (Last Updated: 02/10/2015 16:37:45 EST by Colon Branch, RN, MELISSA)          Electronic Cigarette/Vaping:        Never Electronic Cigarette Use.   (Last Updated: 05/20/2019 13:14:36 EDT by Kenard Gower, RN, JOANNE L)          Alcohol:        Current, 1-2 times per month   (Last Updated: 05/20/2019 13:14:52 EDT by Kenard Gower, RN, Cora Collum)          Substance Use:        Opioid Naive - not currently taking opioids, Denies   (Last Updated: 05/20/2019 13:15:07 EDT by Kenard Gower RN, Cora Collum)            Advance Directive   Advance Directive :   No   Josephine Cables RN, Maurice March - 05/02/2020 13:57 EDT   Harm Screen   Suspect or Concern for: :   None   Feels Safe Where Live :   Yes   Velia Meyer - 05/07/2020 7:36 EDT

## 2020-05-07 LAB — BASIC METABOLIC PANEL
Anion Gap: 13 mmol/L (ref 2–17)
BUN: 16 mg/dL (ref 6–20)
CO2: 20 mmol/L — ABNORMAL LOW (ref 22–29)
Calcium: 9.4 mg/dL (ref 8.6–10.0)
Chloride: 107 mmol/L (ref 98–107)
Creatinine: 0.7 mg/dL (ref 0.5–1.0)
GFR African American: 142 mL/min/{1.73_m2} (ref >=90)
GFR Non-African American: 122 mL/min/{1.73_m2} (ref >=90)
Glucose: 97 mg/dL (ref 70–99)
OSMOLALITY CALCULATED: 279 mOsm/kg (ref 270–287)
Potassium: 4.3 mmol/L (ref 3.5–5.3)
Sodium: 139 mmol/L (ref 135–145)

## 2020-05-07 LAB — HEPATIC FUNCTION PANEL
ALT: 15 U/L (ref 0–35)
AST: 21 U/L (ref 0–35)
Albumin: 4.2 g/dL (ref 3.5–5.2)
Alk Phosphatase: 67 U/L (ref 35–117)
Bilirubin, Direct: <0.2 mg/dL (ref 0.00–0.30)
Total Bilirubin: 0.5 mg/dL (ref 0.00–1.20)
Total Protein: 6.5 g/dL (ref 6.4–8.3)

## 2020-05-07 LAB — CELL COUNT WITH DIFFERENTIAL, CSF
Lymphocyte, CSF: 95 % — ABNORMAL HIGH (ref 40–80)
Monos,Csf: 5 % — ABNORMAL LOW (ref 15–45)
RBC, CSF: 1 uL — ABNORMAL HIGH (ref 0–0)
Total Nucleated Cells CSF: 1 uL (ref 0–5)
Volume, CSF: 7 mL
Xanthochromia: NEGATIVE

## 2020-05-07 LAB — GLUCOSE, CSF: Glucose, CSF: 62 mg/dL (ref 40–70)

## 2020-05-07 LAB — PROTEIN, CSF: Csf Protein: 18.3 mg/dL (ref 15.0–45.0)

## 2020-05-07 NOTE — H&P (Signed)
Preoperative H&P: General *        Patient:   Carrie Shields, Carrie Shields            MRN: [id number]            FIN: [id number]               Age:   25 years     Sex:  Female     DOB:  1995-06-28   Associated Diagnoses:   None   Author:   Luci Bank F-MD      Chief Complaint   H/o idiopathic intracranial hypertension.  Worsening headaches with photophobia and new onset vertigo over the past 2-3 weeks.      Health Status   Allergies:    Allergic Reactions (Selected)  Moderate  Penicillin- Rash.  Unknown  Augmentin- Hives / itching.  Ertapenem- Hives / itching.,    Allergies (3) Active Reaction  penicillin Rash  Augmentin Hives / Itching  ertapenem Hives / Itching     Current medications:  (Selected)   Documented Medications  Documented  FLUoxetine 20 mg oral capsule: 40 mg, 2 caps, Oral, qAM, 0 Refill(s)  Junel Fe 1/20 oral tablet: 1 tabs, Oral, qAM, 0 Refill(s)  Valium 10 mg oral tablet: 10 mg, 1 tabs, Oral, On Call, pre-procedures, PRN: other (see comment), 0 Refill(s)  acetaZOLAMIDE 500 mg oral capsule, extended release: 1,000 mg, 2 caps, Oral, BID, 30 cap, 0 Refill(s)  lamoTRIgine 150 mg oral tablet: 150 mg, 1 tabs, Oral, qAM, 0 Refill(s)  topiramate 25 mg oral tablet: 50 mg, 2 tabs, Oral, Daily, 180 tabs, 0 Refill(s),    Home Medications (6) Active  acetaZOLAMIDE 500 mg oral capsule, extended release 1,000 mg = 2 caps, Oral, BID  FLUoxetine 20 mg oral capsule 40 mg = 2 caps, Oral, qAM  Junel Fe 1/20 oral tablet 1 tabs, Oral, qAM  lamoTRIgine 150 mg oral tablet 150 mg = 1 tabs, Oral, qAM  topiramate 25 mg oral tablet 50 mg = 2 tabs, Oral, Daily  Valium 10 mg oral tablet 10 mg = 1 tabs, PRN, Oral, On Call  ,    No qualifying data available     Problem list:    Patient Stated (Selected)  Anxiety and depression / [id number] / Confirmed  Blurred vision / [id number] / Confirmed  History of COVID-19 / [phone] / Confirmed  Migraine / 48546270 / Confirmed  Intracranial hypertension / 350093818 / Confirmed  Seasonal allergies /  [phone] / Confirmed  Wears glasses / 353614431 / Confirmed,    Active Problems (7)  Anxiety and depression   Blurred vision   History of COVID-19   Intracranial hypertension   Migraine   Seasonal allergies   Wears glasses         Histories   Past Medical History:    Resolved  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   Family History:    No family history items have been selected or recorded.   Procedure history:    LUMBAR PUNCTURE on 03/28/2019 at 23 Years.  Comments:  03/28/2019 9:05 EDT - Zadie Rhine, RN, GRACE T  recurrent q78monthas of 2020  Ankle and/or Foot Incision and Drainage (Right) on 10/13/2017 at 22 Years.  Comments:  10/13/2017 13:00 EDT - ABELS, RN, PENNE L  auto-populated from documented surgical case  Ankle Achilles Tendon Repair (Right) on 07/26/2017 at 22 Years.  Comments:  75/39/7673141:93EDT - FLoma Boston RN,  Elizabeth  auto-populated from documented surgical case  Ankle and/or Foot Incision and Drainage on 03/04/2015 at 19 Years.  Comments:  03/04/2015 15:04 EST - DANIEL, RN, MELISSA S  auto-populated from documented surgical case  Ankle Achilles Tendon Repair (Left) on 02/12/2015 at 36 Years.  Comments:  02/12/2015 18:26 EST - WELLS, RN, RON D  auto-populated from documented surgical case  Ankle and/or Foot Incision and Drainage (Left) on 02/12/2015 at 19 Years.  Comments:  02/12/2015 18:26 EST - WELLS, RN, RON D  auto-populated from documented surgical case  Ankle Achilles Tendon Repair (Ankle, Left) on 01/19/2015 at 19 Years.  Comments:  01/19/2015 14:45 EST - ZEIF, RN, REBECCA N  auto-populated from documented surgical case  Extraction of wisdom tooth ([id number]) on 01/22/2014 at 9 Years.  Arthroscopy ([id number]) on 01/13/2013 at 17 Years.  Comments:  01/19/2015 9:40 EST - MCBRIDE, RN, ERIN R  RIGHT KNEE   Social History        Social & Psychosocial Habits    Alcohol  05/20/2019  Use: Current    Frequency:  1-2 times per month    Substance Use  05/20/2019  Opioid Assessment Opioid Naive-not taking    Use: Denies    Tobacco  02/10/2015  Use: Never smoker    Electronic Cigarette/Vaping  05/20/2019  Electronic Cigarette Use: Never  .        Physical Examination   Vital Signs   9/32/6712 4:58 EDT Systolic Blood Pressure 099 mmHg    Diastolic Blood Pressure 67 mmHg    Temperature Oral 37 degC    Heart Rate Monitored 61 bpm    Respiratory Rate 20 br/min    SpO2 99 %         Vital Signs (last 24 hrs)_____  Last Charted___________  Temp Oral     37 degC  (APR 25 07:35)  Resp Rate         20 br/min  (APR 25 07:35)  SBP      113 mmHg  (APR 25 07:35)  DBP      67 mmHg  (APR 25 07:35)  SpO2      99 %  (APR 25 07:35)  Weight      104 kg  (APR 25 07:28)  Height      182.9 cm  (APR 25 07:28)  BMI      31.09  (APR 25 07:28)     Measurements from flowsheet : Measurements   05/07/2020 7:28 EDT Body Mass Index est meas 31.09 kg/m2    Body Mass Index Measured 31.09 kg/m2   05/07/2020 7:28 EDT Height/Length Measured 182.9 cm    Weight Measured 104 kg      General:  Alert and oriented, No acute distress.    Respiratory:  Respirations are non-labored.    Neurologic:  Alert, Oriented.    Psychiatric:  Cooperative, Appropriate mood & affect.       Health Maintenance      Health Maintenance     Pending (in the next year)        Due            Cervical Cancer Screening due  05/07/20  and every 3  years           HPV Vaccine Dose 1 due  05/07/20  One-time only           Hepatitis A Dose 1 due  05/07/20  One-time only  Hepatitis B Dose 1 due  05/07/20  One-time only           MMR Vaccine Dose 1 due  05/07/20  One-time only           Meningococcal Dose 1 due  05/07/20  One-time only           Pertussis Vaccine due  05/07/20  One-time only           Tetanus/TD Vaccine due  05/07/20  and every 10  years           Varicella Vaccine Dose 1 due  05/07/20  One-time only        Due In Future            Influenza Vaccine not due until  09/13/20  and  every 1  years     Satisfied (in the past 1 year)     There are no satisfied recommendations within the defined date range          Review / Management   Results review:     No qualifying data available, Lab results   05/07/2020 7:50 EDT Bili Direct <0.20 mg/dL   05/07/2020 7:28 EDT Estimated Creatinine Clearance 182.07 mL/min   .       Impression and Plan   Diagnosis     Assessment:  H/o IIH, worsening HA with photophobia and vertigo.  Plan:  LP with opening pressure and therapeutic and diagnostic collection of fluid.Psychologist, counselling Signed on 05/07/2020 08:46 AM EDT   ________________________________________________   Luci Bank F-MD

## 2020-05-07 NOTE — Op Note (Signed)
Recovery Phase III Record - [facility]             Recovery Phase III Record - [facility] Summary                                                        Primary Physician:        Ardyth Harps F-MD    Case Number:              [id number]    Finalized Date/Time:      05/07/20 12:52:58    Pt. Name:                 Carrie Shields, Carrie Shields    D.O.B./Sex:               05-31-1995    Female    Med Rec #:                [id number]    Physician:                Ardyth Harps F-MD    Financial #:              [id number]    Pt. Type:                 O    Room/Bed:                 /    Admit/Disch:              05/07/20 07:20:00 -    Institution:       [facility] Case Attendance - Phase III                                                                                          Entry 1                                                                                                          Case Attendee             Ardyth Harps F-MD            Role Performed                  Surgeon Primary    Last Modified By:         Earlene Plater, RN, Mordecai Maes  05/07/20 11:00:03      [facility] - Case Times - Phase III                                                                                             Entry 1                                                                                                          Phase III In              05/07/20 10:35:00               Phase III Out                   05/07/20 12:37:00    Phase III Discharge       05/07/20 12:37:00    Time     Last Modified By:         Earlene Plater, RN, Therese                              05/07/20 12:52:56              Finalized By: Earlene Plater RN, [facility]      Document Signatures                                                                             Signed By:           Earlene Plater, RN, [facility] 05/07/20 12:52

## 2020-05-07 NOTE — Assessment & Plan Note (Signed)
Pre-Procedure Record - [facility]             Pre-Procedure Record - [facility] Summary                                                             Primary Physician:        Ardyth Harps F-MD    Case Number:              [id number]    Finalized Date/Time:      05/07/20 07:53:34    Pt. Name:                 Carrie Shields, Carrie Shields    D.O.B./Sex:               July 02, 1995    Female    Med Rec #:                [id number]    Physician:                Ardyth Harps F-MD    Financial #:              [id number]    Pt. Type:                 O    Room/Bed:                 /    Admit/Disch:              05/07/20 07:20:00 -    Institution:       [facility] Case Attendance - Pre-Procedure                                                                                      Entry 1                                                                                                          Case Attendee             Ardyth Harps F-MD            Role Performed                  Surgeon Primary    Last Modified By:         Earlene Plater, RN, Mordecai Maes  05/07/20 07:28:32      [facility] - Case Times - Pre-Procedure                                                                                         Entry 1                                                                                                          Patient In Room Time      05/07/20 07:20:00               Nurse In Time                   05/07/20 07:20:00    Nurse Out Time            05/07/20 07:53:00               Patient Ready for               05/07/20 07:53:00                                                              Surgery/Procedure     Last Modified By:         Earlene Plater, RN, Therese                              05/07/20 07:53:26              Finalized By: Earlene Plater RN, [facility]      Document Signatures                                                                             Signed By:           Earlene Plater, RN, [facility] 05/07/20 07:53

## 2020-09-06 NOTE — Nursing Note (Signed)
Adult Admission Assessment - Text       Perioperative Admission Assessment Entered On:  09/06/2020 15:13 EDT    Performed On:  09/06/2020 15:10 EDT by Melina Modena W-RN               General   Call Start :   09/06/2020 15:39 EDT   Call Complete :   09/06/2020 15:46 EDT   Height/Length Estimated :   182.88 cm(Converted to: 72.00 in)    Weight   Estimated :   106.594 kg(Converted to: 235.000 lb)    Body Mass Index Estimated :   31.87 kg/m2   PAT Patient Procedure Verification :   Patient name and DOB confirmed with patient, Correct procedure scheduled confirmed with patient, Correct side/site confirmed with patient   Languages :   [place]   Preferred Communication Mode :   Verbal   Information Given By :   Self         Primary Care Physician/Specialists :   DR. Cyril Mourning, PCP  DR. HAMID Hilton Cork W-RN - 09/06/2020 15:38 EDT     Day of Proc [place] Prsn is the Emerg Cont :   No   PAT Patient/Procedure Verification :   [name]   Emergency Contact Phone :   [phone], CELL   Emergency Contact Relationship :   MOTHER   Melina Modena W-RN - 09/06/2020 15:10 EDT   Allergies   (As Of: 09/12/2020 09:03:34 EDT)   Allergies (Active)   Augmentin  Estimated Onset Date:   Unspecified ; Reactions:   Hives / Itching ; Created By:   Lady Gary, RN, [name]; Reaction Status:   Active ; Category:   Drug ; Substance:   Augmentin ; Type:   Allergy ; Severity:   Unknown ; Updated By:   Lady Gary, RN, Charm Barges; Reviewed Date:   09/12/2020 9:01 EDT      ertapenem  Estimated Onset Date:   Unspecified ; Reactions:   Hives / Itching ; Created By:   Lady Gary, RN, [name]; Reaction Status:   Active ; Category:   Drug ; Substance:   ertapenem ; Type:   Allergy ; Severity:   Unknown ; Updated By:   Lady Gary RN, Charm Barges; Reviewed Date:   09/12/2020 9:01 EDT      penicillin  Estimated Onset Date:   Unspecified ; Reactions:   Rash ; Created By:   Jorge Mandril, RN, [name]; Reaction Status:   Active ; Category:   Drug ; Substance:   penicillin ;  Type:   Allergy ; Severity:   Moderate ; Updated By:   Jorge Mandril, RN, Vista Lawman; Reviewed Date:   09/12/2020 9:01 EDT        Medication History   Medication List   (As Of: 09/12/2020 09:03:34 EDT)   Normal Order    Sodium Chloride 0.9% intravenous solution 500 mL  :   Sodium Chloride 0.9% intravenous solution 500 mL ; Status:   Ordered ; Ordered As Mnemonic:   Sodium Chloride 0.9% 500 mL ; Simple Display Line:   30 mL/hr, IV ; Ordering Provider:   YOUNG,  SCOTT ALLEN-PA; Catalog Code:   Sodium Chloride 0.9% ; Order Dt/Tm:   09/07/2020 09:00:03 EDT ; Comment:   Perioperative use ONLY            Home Meds    topiramate  :   topiramate ; Status:   Documented ; Ordered  As Mnemonic:   topiramate 25 mg oral tablet ; Simple Display Line:   50 mg, 2 tabs, Oral, qAM, 180 tabs, 0 Refill(s) ; Catalog Code:   topiramate ; Order Dt/Tm:   05/02/2020 13:59:14 EDT ; Comment:   SOUND ALIKE / LOOK ALIKE - VERIFY DRUG          acetaZOLAMIDE  :   acetaZOLAMIDE ; Status:   Documented ; Ordered As Mnemonic:   acetaZOLAMIDE 500 mg oral capsule, extended release ; Simple Display Line:   1,000 mg, 2 caps, Oral, BID, 30 cap, 0 Refill(s) ; Catalog Code:   acetaZOLAMIDE ; Order Dt/Tm:   05/20/2019 13:18:18 EDT          diazepam  :   diazepam ; Status:   Documented ; Ordered As Mnemonic:   Valium 10 mg oral tablet ; Simple Display Line:   10 mg, 1 tabs, Oral, On Call, pre-procedures, PRN: other (see comment), 0 Refill(s) ; Catalog Code:   diazepam ; Order Dt/Tm:   03/28/2019 09:08:51 EDT          FLUoxetine  :   FLUoxetine ; Status:   Documented ; Ordered As Mnemonic:   FLUoxetine 20 mg oral capsule ; Simple Display Line:   40 mg, 2 caps, Oral, qAM, 0 Refill(s) ; Catalog Code:   FLUoxetine ; Order Dt/Tm:   03/24/2019 11:59:17 EST          lamoTRIgine  :   lamoTRIgine ; Status:   Documented ; Ordered As Mnemonic:   lamoTRIgine 150 mg oral tablet ; Simple Display Line:   150 mg, 1 tabs, Oral, qAM, 0 Refill(s) ; Catalog Code:   lamoTRIgine ; Order Dt/Tm:    03/24/2019 11:59:17 EST          norethindrone-ethinyl estradiol  :   norethindrone-ethinyl estradiol ; Status:   Documented ; Ordered As Mnemonic:   Junel Fe 1/20 oral tablet ; Simple Display Line:   1 tabs, Oral, qAM, 0 Refill(s) ; Catalog Code:   norethindrone-ethinyl estradiol ; Order Dt/Tm:   03/24/2019 11:59:17 EST            Problem History   (As Of: 09/12/2020 09:03:34 EDT)   Problems(Active)    Anxiety and depression (SNOMED CT  :96045409 )  Name of Problem:   Anxiety and depression ; Recorder:   Lady Gary, RN, [name]; Confirmation:   Confirmed ; Classification:   Patient Stated ; Code:   [id number] ; Contributor System:   PowerChart ; Last Updated:   10/09/2017 9:08 EDT ; Life Cycle Date:   10/09/2017 ; Life Cycle Status:   Active ; Vocabulary:   SNOMED CT        Blurred vision (SNOMED CT  :295621308 )  Name of Problem:   Blurred vision ; Recorder:   Avelino Leeds L-RN; Confirmation:   Confirmed ; Classification:   Patient Stated ; Code:   [id number] ; Contributor System:   Dietitian ; Last Updated:   05/20/2019 13:23 EDT ; Life Cycle Date:   05/20/2019 ; Life Cycle Status:   Active ; Vocabulary:   SNOMED CT        History of COVID-19 (SNOMED CT  :9528413244 )  Name of Problem:   History of COVID-19 ; Onset Date:   07/2018 ; Recorder:   Avelino Leeds L-RN; Confirmation:   Confirmed ; Classification:   Patient Stated ; Code:   [id number] ; Contributor System:   PowerChart ; Last Updated:  05/20/2019 13:12 EDT ; Life Cycle Status:   Active ; Vocabulary:   SNOMED CT        Intracranial hypertension (SNOMED CT  :161096045 )  Name of Problem:   Intracranial hypertension ; Recorder:   Rosalva Ferron; Confirmation:   Confirmed ; Classification:   Patient Stated ; Code:   [id number] ; Contributor System:   Dietitian ; Last Updated:   06/29/2018 15:20 EDT ; Life Cycle Date:   06/29/2018 ; Life Cycle Status:   Active ; Vocabulary:   SNOMED CT        Migraine (SNOMED CT  :78295621 )  Name of Problem:   Migraine ; Recorder:    Earl Lites, RN, Vista Lawman; Confirmation:   Confirmed ; Classification:   Patient Stated ; Code:   [id number] ; Contributor System:   PowerChart ; Last Updated:   06/14/2018 14:33 EDT ; Life Cycle Date:   06/14/2018 ; Life Cycle Status:   Active ; Vocabulary:   SNOMED CT        Pseudotumor cerebri (SNOMED CT  :696295284 )  Name of Problem:   Pseudotumor cerebri ; Recorder:   LUTZ,  [name]-RN; Confirmation:   Confirmed ; Classification:   Patient Stated ; Code:   [id number] ; Contributor System:   Dietitian ; Last Updated:   09/06/2020 15:43 EDT ; Life Cycle Date:   09/06/2020 ; Life Cycle Status:   Active ; Vocabulary:   SNOMED CT        Seasonal allergies (SNOMED CT  :[phone] )  Name of Problem:   Seasonal allergies ; Recorder:   MCBRIDE, RN, ERIN R; Confirmation:   Confirmed ; Classification:   Patient Stated ; Code:   [phone] ; Contributor System:   PowerChart ; Last Updated:   01/19/2015 9:39 EST ; Life Cycle Date:   01/19/2015 ; Life Cycle Status:   Active ; Vocabulary:   SNOMED CT        Wears glasses (SNOMED CT  :376283151 )  Name of Problem:   Wears glasses ; Recorder:   Rosalva Ferron; Confirmation:   Confirmed ; Classification:   Patient Stated ; Code:   [id number] ; Contributor System:   Dietitian ; Last Updated:   06/29/2018 15:20 EDT ; Life Cycle Date:   06/29/2018 ; Life Cycle Status:   Active ; Vocabulary:   SNOMED CT          Procedure History        -    Procedure History   (As Of: 09/12/2020 09:03:34 EDT)     Procedure Dt/Tm:   01/22/2014 ; Anesthesia Minutes:   0 ; Procedure Name:   Extraction of wisdom tooth ; Procedure Minutes:   0 ; Last Reviewed Dt/Tm:   09/12/2020 09:03:07 EDT            Procedure Dt/Tm:   01/13/2013 ; Anesthesia Minutes:   0 ; Procedure Name:   Arthroscopy ; Procedure Minutes:   0 ; Comments:     01/19/2015 9:40 EST - Adin Hector, RN, ERIN R  RIGHT KNEE ; Last Reviewed Dt/Tm:   09/12/2020 09:03:07 EDT            Procedure Dt/Tm:   01/19/2015  13:19:00 EST ; Location:   [facility] ; Provider:   LAMB-MD,  JOSHUA H; Anesthesia Type:   General ; :   FERLA-MD,  BRIAN P; Anesthesia Minutes:   0 ; Procedure Name:   Ankle Achilles Tendon Repair (  Ankle, Left) ; Procedure Minutes:   9374 ; Comments:     01/19/2015 14:45 EST - ZEIF, RN, REBECCA N  auto-populated from documented surgical case ; Clinical Service:   Surgery ; Last Reviewed Dt/Tm:   09/12/2020 09:03:07 EDT            Procedure Dt/Tm:   02/12/2015 16:52:00 EST ; Location:   [facility] ; Provider:   LAMB-MD,  JOSHUA H; Anesthesia Type:   General ; :   FERLA-MD,  BRIAN P; Anesthesia Minutes:   0 ; Procedure Name:   Ankle Achilles Tendon Repair (Left) ; Procedure Minutes:   2581 ; Comments:     02/12/2015 18:26 EST - WELLS, RN, RON D  auto-populated from documented surgical case ; Clinical Service:   Surgery ; Last Reviewed Dt/Tm:   09/12/2020 09:03:07 EDT            Procedure Dt/Tm:   02/12/2015 16:52:00 EST ; Location:   [facility] ; Provider:   LAMB-MD,  JOSHUA H; Anesthesia Type:   General ; :   FERLA-MD,  BRIAN P; Anesthesia Minutes:   0 ; Procedure Name:   Ankle and/or Foot Incision and Drainage (Left) ; Procedure Minutes:   6081 ; Comments:     02/12/2015 18:26 EST - WELLS, RN, RON D  auto-populated from documented surgical case ; Clinical Service:   Surgery ; Last Reviewed Dt/Tm:   09/12/2020 09:03:07 EDT            Procedure Dt/Tm:   03/04/2015 13:36:00 EST ; Location:   [facility] ; Provider:   Sibyl ParrLAMB-MD,  JOSHUA H; Anesthesia Type:   General ; :   Jenetta LogesRAMER-MD,  WILLIAM E; Anesthesia Minutes:   0 ; Procedure Name:   Ankle and/or Foot Incision and Drainage ; Procedure Minutes:   1502 ; Comments:     03/04/2015 15:04 EST - DANIEL, RN, MELISSA S  auto-populated from documented surgical case ; Clinical Service:   Surgery ; Last Reviewed Dt/Tm:   09/12/2020 09:03:07 EDT            Procedure Dt/Tm:   07/26/2017 15:26:00 EDT ; Location:   [facility] ; Provider:   Marcine MatarHLSON-MD,  BLAKE; Anesthesia Type:   General ; :   PENDARVIS-MD, JR, ALLEN E;  Anesthesia Minutes:   0 ; Procedure Name:   Ankle Achilles Tendon Repair (Right) ; Procedure Minutes:   31 ; Comments:     07/26/2017 16:05 EDT - Stacie AcresFelice, RN, Elizabeth  auto-populated from documented surgical case ; Clinical Service:   Surgery ; Last Reviewed Dt/Tm:   09/12/2020 09:03:07 EDT            Procedure Dt/Tm:   10/13/2017 12:32:00 EDT ; Location:   [facility] ; Provider:   Marcine MatarHLSON-MD,  BLAKE; Anesthesia Type:   General ; :   CHOI-MD,  YOUNG; Anesthesia Minutes:   0 ; Procedure Name:   Ankle and/or Foot Incision and Drainage (Right) ; Procedure Minutes:   20 ; Comments:     10/13/2017 13:00 EDT - ABELS, RN, PENNE L  auto-populated from documented surgical case ; Clinical Service:   Surgery ; Last Reviewed Dt/Tm:   09/12/2020 09:03:07 EDT            Procedure Dt/Tm:   03/28/2019 ; Anesthesia Minutes:   0 ; Procedure Name:   LUMBAR PUNCTURE ; Procedure Minutes:   0 ; Comments:     03/28/2019 9:05 EDT - Luciana AxeANKIN, RN, GRACE T  recurrent q23month as of 2020 ; Last Reviewed Dt/Tm:   09/12/2020 09:03:07 EDT            History Confirmation   Problem History Changes PAT :   No   Procedure History Changes PAT :   No   DENETT, RN, BARBARA L - 09/12/2020 9:01 EDT   Anesthesia/Sedation   Anesthesia History :   Prior general anesthesia   SN - Malignant Hyperthermia :   Denies   Previous Problem with Anesthesia :   None   Moderate Sedation History :   No prior sedation for procedure   Symptoms of Sleep Apnea :   No OSA Symptoms   Symptoms of Sleep Apnea Score (STOP BANG) :   0    Shortness of Breath Indicator :   No shortness of breath   Pregnancy Status :   Patient denies   Last Menstrual Period :   08/12/2020 EDT   Melina Modena W-RN - 09/06/2020 15:38 EDT   Bloodless Medicine   Is Blood Transfusion Acceptable to Patient :   Yes   Melina Modena W-RN - 09/06/2020 15:38 EDT   ID Risk Screen Symptoms   Recent Travel History :   No recent travel   TB Symptom Screen :   No symptoms   Last 90 days COVID-19 ID :   No   Close Contact with COVID-19 ID  :   No   Last 14 days COVID-19 ID :   No   Melina Modena W-RN - 09/06/2020 15:38 EDT   Social History   Social History   (As Of: 09/12/2020 09:03:34 EDT)   Tobacco:        Never smoker   (Last Updated: 02/10/2015 16:37:45 EST by Colon Branch, RN, MELISSA)          Electronic Cigarette/Vaping:        Never Electronic Cigarette Use.   (Last Updated: 05/20/2019 13:14:36 EDT by Avelino Leeds L-RN)          Alcohol:        Current, 1-2 times per month   (Last Updated: 05/20/2019 13:14:52 EDT by Walker Kehr)          Substance Use:        Opioid Naive - not currently taking opioids, Denies   (Last Updated: 05/20/2019 13:15:07 EDT by Avelino Leeds L-RN)            Advance Directive   Advance Directive :   No   Melina Modena W-RN - 09/06/2020 15:38 EDT   Harm Screen   Feels Safe Where Live :   Yes   Last 3 mo, thoughts killing self/others :   Patient denies   DENETT, RN, BARBARA L - 09/12/2020 9:01 EDT

## 2020-09-12 LAB — BASIC METABOLIC PANEL
Anion Gap: 11 mmol/L (ref 2–17)
BUN: 11 mg/dL (ref 6–20)
CO2: 17 mmol/L — ABNORMAL LOW (ref 22–29)
Calcium: 8.9 mg/dL (ref 8.6–10.0)
Chloride: 114 mmol/L — ABNORMAL HIGH (ref 98–107)
Creatinine: 0.8 mg/dL (ref 0.5–1.0)
Est, Glom Filt Rate: 106 mL/min/1.73m?? (ref >=90)
Glucose: 90 mg/dL (ref 70–99)
OSMOLALITY CALCULATED: 282 mOsm/kg (ref 270–287)
Potassium: 3.7 mmol/L (ref 3.5–5.3)
Sodium: 142 mmol/L (ref 135–145)

## 2020-09-12 LAB — CELL COUNT WITH DIFFERENTIAL, CSF
Lymphocyte, CSF: 46 % (ref 40–80)
Polys, CSF: 54 % — ABNORMAL HIGH (ref 0–6)
RBC, CSF: 51 uL — ABNORMAL HIGH (ref 0–0)
Total Nucleated Cells CSF: 2 uL (ref 0–5)
Tube Number, CSF: 3
Volume, CSF: 8 mL
Xanthochromia: NEGATIVE

## 2020-09-12 LAB — GLUCOSE, CSF: Glucose, CSF: 59 mg/dL (ref 40–70)

## 2020-09-12 LAB — PROTEIN, CSF: Csf Protein: 22.6 mg/dL (ref 15.0–45.0)

## 2020-09-12 NOTE — Op Note (Signed)
Recovery Phase III Record - [facility]             Recovery Phase III Record - [facility] Summary                                                        Primary Physician:        Rodena Piety [facility]    Case Number:              [id number]    Finalized Date/Time:      09/12/20 16:11:51    Pt. Name:                 Carrie Shields, Carrie Shields    D.O.B./Sex:               [birth year]    Female    Med Rec #:                [id number]    Physician:                Aretta Nip R-MD    Financial #:              [id number]    Pt. Type:                 O    Room/Bed:                 /    Admit/Disch:              09/12/20 08:42:00 -    Institution:       [facility] Case Attendance - Phase III                                                                                          Entry 1                                                                                                          Case Attendee             [facility]se[facility] Hearing         Role Performed                  Surgeon Primary    Last Modified By:         Carin Hock, RN, Darl Pikes  09/12/20 13:50:03      [facility] - Case Times - Phase III                                                                                             Entry 1                                                                                                          Phase III In              09/12/20 13:40:00               Phase III Out                   09/12/20 16:11:00    Phase III Discharge       09/12/20 16:11:00    Time     Last Modified By:         Gurney Maxin, RN, MONICA H                              09/12/20 16:11:50              Finalized By: Gurney Maxin RN, MONICA H      Document Signatures                                                                             Signed By:           Gurney Maxin, RN, MONICA H 09/12/20 16:11

## 2020-09-12 NOTE — Assessment & Plan Note (Signed)
Pre-Procedure Record - [facility]             Pre-Procedure Record - [facility] Summary                                                             Primary Physician:        Rodena Piety [facility]    Case Number:              [id number]    Finalized Date/Time:      09/12/20 09:09:12    Pt. Name:                 Carrie Shields, Carrie Shields    D.O.B./Sex:               [birth year]    Female    Med Rec #:                [id number]    Physician:                Aretta Nip R-MD    Financial #:              [id number]    Pt. Type:                 O    Room/Bed:                 /    Admit/Disch:              09/12/20 08:42:00 -    Institution:       [facility] Case Attendance - Pre-Procedure                                                                                      Entry 1                                                                                                          Case Attendee             Corwin Levins         Role Performed                  Surgeon Primary    Last Modified By:         Ailene Ravel RN, Denice Bors  09/12/20 08:26:37      [facility] - Case Times - Pre-Procedure                                                                                         Entry 1                                                                                                          Patient In Room Time      09/12/20 08:25:00               Nurse In Time                   09/12/20 08:49:00    Nurse Out Time            09/12/20 09:08:00               Patient Ready for               09/12/20 09:09:00                                                              Surgery/Procedure     Last Modified By:         Ailene Ravel RN, Britta Mccreedy L                              09/12/20 09:09:04              Finalized By: Rayfield Citizen      Document Signatures                                                                             Signed By:           Cecille Aver, Denice Bors 09/12/20 09:09

## 2020-09-12 NOTE — Procedures (Signed)
Lumbar Puncture Procedure        Patient:   Carrie Shields, Carrie Shields            MRN: [id number]            FIN: [id number]               Age:   25 years     Sex:  Female     DOB:  05-Apr-1995   Associated Diagnoses:   None   Author:   Rodena Piety [facility]      Procedure   Lumbar puncture procedure   Date/ Time:  09/12/2020 14:33:00.     Time Out: Completed.     Indication: diagnostic aspiration of fluid, measurement of CSF pressure.     Preparation: Povidone-Iodine.     Technique: Needle size 20 gauge - 6 inches.     Sedation: Yes.     Sterile Technique: Hand Hygiene, Sterile Drape, Sterile Gloves, Mask.     Findings: opening pressure  180  mm H2O, closing pressure  90  mm H2O, 18  ml fluid obtained.     Procedure tolerated: well.     Patient preparation by Lorin Picket Rorher RA, puncture by Dr Ma Rings.     LP   Signature Line     Electronically Signed on 09/12/2020 02:35 PM EDT   ________________________________________________   Rodena Piety CHRISMAN-MD

## 2023-04-17 ENCOUNTER — Ambulatory Visit
Admit: 2023-04-17 | Discharge: 2023-04-17 | Payer: PRIVATE HEALTH INSURANCE | Attending: Family Medicine | Primary: Family Medicine

## 2023-04-17 VITALS — BP 112/76 | HR 118 | Temp 99.00000°F | Resp 19 | Ht 72.0 in | Wt 248.1 lb

## 2023-04-17 DIAGNOSIS — R350 Frequency of micturition: Secondary | ICD-10-CM

## 2023-04-17 LAB — AMB POC URINALYSIS DIP STICK AUTO W/O MICRO
Bilirubin, Urine, POC: NEGATIVE
Blood (UA POC): NEGATIVE
Glucose, Urine, POC: NEGATIVE
Ketones, Urine, POC: NEGATIVE
Leukocyte Esterase, Urine, POC: NEGATIVE
Nitrite, Urine, POC: NEGATIVE
Specific Gravity, Urine, POC: 1.03 (ref 1.001–1.035)
Urobilinogen, POC: NORMAL mg/dL (ref <1.1)
pH, Urine, POC: 6 (ref 4.6–8.0)

## 2023-04-17 LAB — AMB POC URINE PREGNANCY TEST, VISUAL COLOR COMPARISON: HCG, Pregnancy, Urine, POC: NEGATIVE

## 2023-04-17 MED ORDER — NITROFURANTOIN MONOHYD MACRO 100 MG PO CAPS
100 | ORAL_CAPSULE | Freq: Two times a day (BID) | ORAL | 0 refills | Status: AC
Start: 2023-04-17 — End: 2023-04-22

## 2023-04-17 MED ORDER — FLUCONAZOLE 150 MG PO TABS
150 | ORAL_TABLET | Freq: Every day | ORAL | 0 refills | Status: AC
Start: 2023-04-17 — End: 2023-04-19

## 2023-04-17 NOTE — Progress Notes (Signed)
 Chief Complaint  Chief Complaint   Patient presents with    Urinary Frequency     Tuesday kept going to bathroom Wednesday worse and yesterday even worse every 2-3 minute she has the urge no pain         Subjective     HPI:  Carrie Shields with (DOB:  September 10, 1995) is a 28 y.o. female, here for evaluation of the following chief complaint(s):  Urinary Frequency (Tuesday kept going to bathroom Wednesday worse and yesterday even worse every 2-3 minute she has the urge no pain )    Patient with increase in urgency to go, and frequency, started 3 days ago.  Worsened yesterday.   Denies F/C, N/V. No new vaginal discharge, no concerns about STDs.   Denies any blood in urine.       History provided by:  Patient  Language interpreter used: No         Allergies   Allergen Reactions    Hydromorphone Itching    Amoxicillin-Pot Clavulanate      Other reaction(s): hives, itching    Penicillin G      Other Reaction(s): Rash    Event:     Past Medical History:   Diagnosis Date    Pseudotumor     Seasonal allergies      Past Surgical History:   Procedure Laterality Date    ACHILLES TENDON SURGERY Bilateral     KNEE CARTILAGE SURGERY Right     WISDOM TOOTH EXTRACTION      x4     Current Medications:  Current Outpatient Medications   Medication Sig Dispense Refill    zonisamide (ZONEGRAN) 100 MG capsule Take 1 capsule by mouth daily      JUNEL FE 1/20 1-20 MG-MCG per tablet TAKE 1 TABLET BY MOUTH EVERY DAY FOR 84 DAYS      acetaZOLAMIDE (DIAMOX) 250 MG tablet Take 1 tablet by mouth 2 times daily      nitrofurantoin, macrocrystal-monohydrate, (MACROBID) 100 MG capsule Take 1 capsule by mouth 2 times daily for 5 days 10 capsule 0    fluconazole (DIFLUCAN) 150 MG tablet Take 1 tablet by mouth daily for 2 days TAKE ONE TAB AT ONSET OF ANTIBIOTIC, AND ONE AT END OF COURSE 2 tablet 0    cetirizine (ZYRTEC ALLERGY) 10 MG tablet Take 1 tablet by mouth daily       No current facility-administered medications for this visit.        Review of  System:  Review of Systems    All other systems negative other than noted in HPI         Objective     BP 112/76   Pulse (!) 118   Temp 99 F (37.2 C)   Resp 19   Ht 1.829 m (6')   Wt 112.5 kg (248 lb 1.6 oz)   LMP 03/24/2023 (Exact Date)   SpO2 100%   BMI 33.65 kg/m     Physical Exam:  Physical Exam  Vitals and nursing note reviewed.   Constitutional:       Appearance: Normal appearance. She is obese.   HENT:      Head: Normocephalic and atraumatic.      Mouth/Throat:      Mouth: Mucous membranes are moist.   Eyes:      Conjunctiva/sclera: Conjunctivae normal.   Pulmonary:      Effort: Pulmonary effort is normal. No respiratory distress.   Abdominal:  General: Abdomen is flat.      Palpations: Abdomen is soft.      Tenderness: There is no abdominal tenderness. There is no right CVA tenderness, left CVA tenderness or guarding.   Skin:     General: Skin is warm and dry.   Neurological:      General: No focal deficit present.      Mental Status: She is alert.      Cranial Nerves: No cranial nerve deficit.               Assessment & Plan   ASSESSMENT/PLAN:    ICD-10-CM    1. Frequent urination  R35.0 AMB POC URINALYSIS DIP STICK AUTO W/O MICRO     Culture, Urine     nitrofurantoin, macrocrystal-monohydrate, (MACROBID) 100 MG capsule     fluconazole (DIFLUCAN) 150 MG tablet      2. Urgency of urination  R39.15 AMB POC URINALYSIS DIP STICK AUTO W/O MICRO     Culture, Urine     POC Urine Pregnancy Test (78295)        1. Frequent urination  -     AMB POC URINALYSIS DIP STICK AUTO W/O MICRO  -     Culture, Urine  -     nitrofurantoin, macrocrystal-monohydrate, (MACROBID) 100 MG capsule; Take 1 capsule by mouth 2 times daily for 5 days, Disp-10 capsule, R-0Normal  -     fluconazole (DIFLUCAN) 150 MG tablet; Take 1 tablet by mouth daily for 2 days TAKE ONE TAB AT ONSET OF ANTIBIOTIC, AND ONE AT END OF COURSE, Disp-2 tablet, R-0Normal  2. Urgency of urination  -     AMB POC URINALYSIS DIP STICK AUTO W/O MICRO  -      Culture, Urine  -     POC Urine Pregnancy Test (62130)    Allergies reviewed with patient for accuracy, and any errors were corrected.   Results for orders placed or performed in visit on 04/17/23   AMB POC URINALYSIS DIP STICK AUTO W/O MICRO   Result Value Ref Range    Color (UA POC) Dark Yellow     Clarity (UA POC) Clear     Glucose, Urine, POC Negative     Bilirubin, Urine, POC Negative     Ketones, Urine, POC Negative     Specific Gravity, Urine, POC 1.030 1.001 - 1.035    Blood (UA POC) Negative     pH, Urine, POC 6.0 4.6 - 8.0    Protein, Urine, POC Trace     Urobilinogen, POC Normal <1.1 mg/dL    Nitrite, Urine, POC Negative Negative    Leukocyte Esterase, Urine, POC Negative    POC Urine Pregnancy Test ([id number])   Result Value Ref Range    Valid Internal Control, POC pass     HCG, Pregnancy, Urine, POC Negative          Patient Instructions   Differential diagnoses for this patient's symptoms include acute cystitis; bladder infection; pyelonephritis; kidney stone; pregnancy; bladder or urinary tract cancer; renal cancer; idiopathic hematuria; STD; yeast infection; BV.  These are NOT in order of likelihood, or concern.   UA shows dark yellow color, clear character; no blood, nitrite or LE.  Sending culture out for confirmation.  UPT negative.   In meantime, starting patient on course of Macrobid, and Diflucan provided in case of yeast infection due to antibiotic therapy.    ER precautions given for worsening of symptoms, fever, vomiting, even if on  antibiotic.   Provided antibiotic prescribed is correct agent, follow up with PCP one week after finishing antibiotic is recommended, so urine can be checked for improvement.         Return in about 3 days (around 04/20/2023) for Lab results, by phone.       This medical documentation was created using an electronic medical record system with Dragon voice recognition. Although this document has been carefully reviewed, there may still be some phonetic and  typographical errors. These errors are purely typographical and due to imperfections of the software program, they do not reflect any compromise in the patient's medical care.    --Verlee Rossetti, MD

## 2023-04-17 NOTE — Patient Instructions (Addendum)
 Differential diagnoses for this patient's symptoms include acute cystitis; bladder infection; pyelonephritis; kidney stone; pregnancy; bladder or urinary tract cancer; renal cancer; idiopathic hematuria; STD; yeast infection; BV.  These are NOT in order of likelihood, or concern.   UA shows dark yellow color, clear character; no blood, nitrite or LE.  Sending culture out for confirmation.  UPT negative.   In meantime, starting patient on course of Macrobid, and Diflucan provided in case of yeast infection due to antibiotic therapy.    ER precautions given for worsening of symptoms, fever, vomiting, even if on antibiotic.   Provided antibiotic prescribed is correct agent, follow up with PCP one week after finishing antibiotic is recommended, so urine can be checked for improvement.

## 2023-04-18 LAB — CULTURE, URINE: FINAL REPORT: 75000 — AB

## 2023-09-16 ENCOUNTER — Encounter

## 2023-09-24 NOTE — Progress Notes (Signed)
 AWAITING ORDERS.

## 2023-09-24 NOTE — Progress Notes (Signed)
 Pre Procedure Patient Instructions     Procedure Location hospital:IR PATIENT ARRIVAL PAT INSTRUCTIONS: [facility]: [address], [place] in the [place] [facility] [address] [place]. Bring your parking ticket with you inside to be validated. Take the covered pathway to the Admitting Entrance.  Follow the signs to the Public Elevators/Lobby. Take the lobby elevators to the 7th Floor. Enter the Procedural Waiting Room, directly in front of you and check in.  Procedure Date 09/24/2023  Arrival Time 830    Your doctor determines your scheduled start time.  Depending on the facility where your procedure is taking place and the scheduled start time, you may be required to arrive up to 2 hours prior.  This is to allow time for registration, your preop assessment, any day of procedure testing and to meet with your care teams members.  This also allows your procedure to be performed earlier should there be a cancellation that day.  We appreciate your patience as we work to provide you with excellent service.    You may be required to be monitored for an extended period post procedure.  Your care team will discuss with you the day of your procedure.    Medications: Follow the medication instructions below to prevent your procedure from being cancelled.    Continue to take your medications as prescribed, with the following exceptions:     Hold or reduce the dosage of the following medication, as discussed:         You DO NOT need to hold Aspirin and NSAIDs prior to your Lumbar Puncture, Myelogram and or Intrathecal Injection.    If you are taking blood thinners and have not been told when to hold the dose, call the doctor who requested your procedure for instructions on when to stop.    If you are taking any diabetes and or weight loss medications (including injectables and shots) not discussed during the call with the PreAdmission Nurse, it is important to call [phone] to discuss important  instructions.    On the day of your procedure, a nurse will review your medications and ask when you last took each one.      Procedure Preparation    Diet Restrictions:IR DIET RESTRICTIONS: No food or drink including gum or mints-2 hours prior to procedure.  NOTHING AFTER: 830        To cancel, reschedule or if you have specific questions about your scheduled procedure call IR Scheduling at [phone] Monday-Friday 8a-4p.    Skin Preparation:  Do not put on any deodorants, lotions, powders, or oils on the day of procedure.  Be sure to put on clean, comfortable loose fitting clothing.    Other Preparation:  Call your doctor right away if you have any fever, cold or flu symptoms            Day of Procedure Patient Instruction:  Do not smoke, vape, chew tobacco, drink alcohol or use recreational drugs on the day of your procedure  Remove all jewelry, piercings, and metal accessories  Do not wear artificial nails and only clear nail polish on natural nails. Nails must be trimmed to fingertip length to make sure the oxygen probe fits properly and to avoid injury  Do not wear artificial eye lashes because they can cause dry eyes, eye scratches, eye infections and allergic reactions  Do not use hair extensions with metal clips or hairstyles near the back of your neck, as it can make it difficult to  safely manage your breathing during anesthesia  If your hair is tightly braided or styled in a complex way, it may need to be undone for your safety  Do not wear contacts, tampons, make-up, lotions, creams, powders, fragrances or deodorant  Wear loose fit clothing  Do not bring valuables or money  Bring a copy of your Living Will and/or Medical Durable Power of Attorney if you have one  Bring a list of current medications including name and dosage  Bring a picture ID and insurance card               If you are going home the same day as your procedure, a support person should accompany you to the facility and must transport  you home.  If you plan to take public transportation of any sort, your support person must accompany you home.  You should have someone to stay with you for 24 hours after your procedure with sedation of any kind.       Comments:     This information was reviewed with you during your Pre-Admission Testing interview and you verbalized understanding. If you have any additional questions please contact [phone].    To pre-register for your procedure OR for billing estimate questions please call [phone] Option 2 and then Option 3.    For financial questions AFTER your procedure is complete please contact [phone].    For MyChart Patient Portal help please call [phone].    For hotel accommodations please visit [email or web address] and select PATIENTS &VISITORS -> "FOR VISITORS" section and then TRAVEL INFORMATION -> PLACES TO STAY

## 2023-09-25 ENCOUNTER — Inpatient Hospital Stay: Payer: PRIVATE HEALTH INSURANCE | Primary: Family Medicine

## 2023-09-25 ENCOUNTER — Inpatient Hospital Stay
Admit: 2023-09-25 | Discharge: 2023-09-25 | Disposition: A | Discharge status: Home / Self Care | Payer: PRIVATE HEALTH INSURANCE | Arrived: WI | Attending: Emergency Medicine

## 2023-09-25 ENCOUNTER — Emergency Department: Admit: 2023-09-25 | Payer: PRIVATE HEALTH INSURANCE | Primary: Family Medicine

## 2023-09-25 DIAGNOSIS — G8918 Other acute postprocedural pain: Secondary | ICD-10-CM

## 2023-09-25 DIAGNOSIS — J029 Acute pharyngitis, unspecified: Principal | ICD-10-CM

## 2023-09-25 LAB — COMPREHENSIVE METABOLIC PANEL
ALT: 18 U/L (ref 0–42)
AST: 13 U/L (ref 0–46)
Albumin/Globulin Ratio: 1.74 (ref 1.00–2.70)
Albumin: 4 g/dL (ref 3.5–5.2)
Alk Phosphatase: 55 U/L (ref 35–117)
Anion Gap: 12 mmol/L (ref 2–17)
BUN: 13 mg/dL (ref 6–20)
CALCIUM,CORRECTED,CCA: 8.7 mg/dL (ref 8.5–10.7)
CO2: 25 mmol/L (ref 22–29)
Calcium: 8.6 mg/dL (ref 8.5–10.7)
Chloride: 102 mmol/L (ref 98–107)
Creatinine: 0.8 mg/dL (ref 0.5–1.0)
Est, Glom Filt Rate: 104 mL/min/1.73m (ref >=60)
Globulin: 2.3 g/dL (ref 1.9–4.4)
Glucose: 93 mg/dL (ref 70–99)
Osmolaliy Calculated: 277 mosm/kg (ref 270–287)
Potassium: 3.7 mmol/L (ref 3.5–5.3)
Sodium: 139 mmol/L (ref 135–145)
Total Bilirubin: 0.57 mg/dL (ref 0.00–1.20)
Total Protein: 6.3 g/dL (ref 5.7–8.3)

## 2023-09-25 LAB — CBC WITH AUTO DIFFERENTIAL
Basophils %: 0.2 % (ref 0.0–2.0)
Basophils Absolute: 0 x10e3/mcL (ref 0.0–0.2)
Eosinophils %: 0.6 % (ref 0.0–7.0)
Eosinophils Absolute: 0.1 x10e3/mcL (ref 0.0–0.5)
Hematocrit: 40.8 % (ref 34.0–47.0)
Hemoglobin: 13.8 g/dL (ref 11.5–15.7)
Immature Grans (Abs): 0.02 x10e3/mcL (ref 0.00–0.06)
Immature Granulocytes %: 0.2 % (ref 0.0–0.6)
Lymphocytes Absolute: 4.1 x10e3/mcL — ABNORMAL HIGH (ref 1.0–3.2)
Lymphocytes: 35.1 % (ref 15.0–45.0)
MCH: 31.1 pg (ref 27.0–34.5)
MCHC: 33.8 g/dL (ref 30.0–36.0)
MCV: 91.9 fL (ref 81.0–99.0)
MPV: 9.1 fL (ref 7.0–12.2)
Monocytes %: 7.4 % (ref 4.0–12.0)
Monocytes Absolute: 0.9 x10e3/mcL (ref 0.3–1.0)
Neutrophils %: 56.5 % (ref 42.0–74.0)
Neutrophils Absolute: 6.5 x10e3/mcL (ref 1.6–7.3)
Platelets: 274 x10e3/mcL (ref 140–440)
RBC: 4.44 x10e6/mcL (ref 3.60–5.20)
RDW: 12.8 % (ref 10.0–17.0)
WBC: 11.6 x10e3/mcL — ABNORMAL HIGH (ref 3.8–10.6)

## 2023-09-25 MED ORDER — DEXAMETHASONE SOD PHOSPHATE PF 10 MG/ML IJ SOLN
10 | Freq: Once | INTRAMUSCULAR | Status: AC
Start: 2023-09-25 — End: 2023-09-25
  Administered 2023-09-25: 13:00:00 6 mg via INTRAVENOUS

## 2023-09-25 MED ORDER — ONDANSETRON HCL 4 MG/2ML IJ SOLN
4 | Freq: Once | INTRAMUSCULAR | Status: AC
Start: 2023-09-25 — End: 2023-09-25
  Administered 2023-09-25: 13:00:00 4 mg via INTRAVENOUS

## 2023-09-25 MED ORDER — IOPAMIDOL 61 % IV SOLN
61 | Freq: Once | INTRAVENOUS | Status: AC | PRN
Start: 2023-09-25 — End: 2023-09-25
  Administered 2023-09-25: 13:00:00 100 mL via INTRAVENOUS

## 2023-09-25 MED ORDER — NALOXONE HCL 4 MG/0.1ML NA LIQD
4 | NASAL | 0 refills | Status: DC | PRN
Start: 2023-09-25 — End: 2023-10-16

## 2023-09-25 MED ORDER — MORPHINE SULFATE (PF) 4 MG/ML IJ SOLN
4 | INTRAMUSCULAR | Status: AC
Start: 2023-09-25 — End: 2023-09-25
  Administered 2023-09-25: 16:00:00 4 mg via INTRAVENOUS

## 2023-09-25 MED ORDER — OXYCODONE-ACETAMINOPHEN 5-325 MG PO TABS
5-325 | ORAL_TABLET | Freq: Four times a day (QID) | ORAL | 0 refills | Status: AC | PRN
Start: 2023-09-25 — End: 2023-09-30

## 2023-09-25 MED ORDER — MORPHINE SULFATE (PF) 4 MG/ML IJ SOLN
4 | INTRAMUSCULAR | Status: AC
Start: 2023-09-25 — End: 2023-09-25
  Administered 2023-09-25: 13:00:00 4 mg via INTRAVENOUS

## 2023-09-25 MED ORDER — SODIUM CHLORIDE 0.9 % IV BOLUS
0.9 | Freq: Once | INTRAVENOUS | Status: AC
Start: 2023-09-25 — End: 2023-09-25
  Administered 2023-09-25: 13:00:00 1000 mL via INTRAVENOUS

## 2023-09-25 MED FILL — MORPHINE SULFATE (PF) 4 MG/ML IJ SOLN: 4 mg/mL | INTRAMUSCULAR | Qty: 1 | Fill #0

## 2023-09-25 MED FILL — ONDANSETRON HCL 4 MG/2ML IJ SOLN: 4 MG/2ML | INTRAMUSCULAR | Qty: 2 | Fill #0

## 2023-09-25 MED FILL — DEXAMETHASONE SOD PHOSPHATE PF 10 MG/ML IJ SOLN: 10 mg/mL | INTRAMUSCULAR | Qty: 1 | Fill #0

## 2023-10-08 ENCOUNTER — Inpatient Hospital Stay: Payer: PRIVATE HEALTH INSURANCE | Primary: Family Medicine

## 2023-10-28 ENCOUNTER — Inpatient Hospital Stay: Admit: 2023-10-28 | Payer: PRIVATE HEALTH INSURANCE | Primary: Family Medicine

## 2023-10-28 VITALS — BP 103/57 | HR 66 | Temp 98.30000°F | Resp 18 | Ht 72.0 in | Wt 253.0 lb

## 2023-10-28 DIAGNOSIS — G932 Benign intracranial hypertension: Principal | ICD-10-CM

## 2023-10-28 LAB — CELL COUNT WITH DIFFERENTIAL, CSF
RBC, CSF: 18 uL — ABNORMAL HIGH (ref 0–0)
Total Nucleated Cells CSF: 1 uL (ref 0–5)
Volume, CSF: 5 mL
Xanthochromia: NEGATIVE

## 2023-10-28 LAB — PROTEIN, CSF: Csf Protein: 20 mg/dL (ref 15.0–45.0)

## 2023-10-28 LAB — GLUCOSE, CSF: Glucose, CSF: 57 mg/dL (ref 40–70)

## 2023-10-28 MED ORDER — SODIUM BICARBONATE 4.2 % IV SOLN
4.2 | INTRAVENOUS | Status: AC
Start: 2023-10-28 — End: 2023-10-28

## 2023-10-28 MED ORDER — SODIUM CHLORIDE 0.9 % IV SOLN
0.9 | INTRAVENOUS | Status: DC
Start: 2023-10-28 — End: 2023-10-31

## 2023-10-28 MED ORDER — LIDOCAINE HCL 1 % IJ SOLN
1 | INTRAMUSCULAR | Status: AC
Start: 2023-10-28 — End: 2023-10-28

## 2023-10-28 MED ORDER — ACETAMINOPHEN 325 MG PO TABS
325 | ORAL | Status: DC | PRN
Start: 2023-10-28 — End: 2023-10-31

## 2023-10-28 MED FILL — LIDOCAINE HCL 1 % IJ SOLN: 1 % | INTRAMUSCULAR | Qty: 20

## 2023-10-28 MED FILL — SODIUM BICARBONATE 4.2 % IV SOLN: 4.2 % | INTRAVENOUS | Qty: 5

## 2023-10-28 NOTE — H&P (Signed)
"  [facility]  Sedation/Analgesia History & Physical    Pt Name: Carrie Shields  MRN: [id number]  Date of Birth: 07/15/1995  Provider Performing Procedure: Glendia Dasie Neysa DEVONNA, MD  Primary Care Physician: Sutterlin, John Douglas, MD    PRE-PROCEDURE   Brief History/Pre-Procedure Diagnosis: Carrie Shields is a 28 y.o. female with a history of benign intracranial hypertension. Patient presents for lumbar puncture. After discussion of risks/benefits of procedure, patient consents to procedure.          MEDICAL HISTORY       has a past medical history of Anxiety, Pseudotumor, and Seasonal allergies.    SURGICAL HISTORY   has a past surgical history that includes Achilles tendon surgery (Bilateral); Knee cartilage surgery (Right); Wisdom tooth extraction; and Tonsillectomy and adenoidectomy (09/17/2023).       ALLERGIES   Allergies as of 10/28/2023 - Fully Reviewed 10/28/2023   Allergen Reaction Noted    Amoxicillin-pot clavulanate Hives and Itching 07/14/2020    Hydromorphone Itching 08/17/2015    Penicillin g Rash 06/14/2018          MEDICATIONS     Current Outpatient Medications:     cetirizine (ZYRTEC) 10 MG tablet, Take 1 tablet by mouth daily as needed for Allergies or Rhinitis, Disp: , Rfl:     zonisamide (ZONEGRAN) 100 MG capsule, Take 1 capsule by mouth daily, Disp: , Rfl:     JUNEL FE 1/20 1-20 MG-MCG per tablet, Take 1 tablet by mouth daily, Disp: , Rfl:     acetaZOLAMIDE (DIAMOX) 250 MG tablet, Take 1 tablet by mouth 2 times daily, Disp: , Rfl:     diazePAM (VALIUM) 5 MG tablet, Take 1 tablet by mouth as needed for Anxiety (Ordered pre procedure medication LP). (Patient not taking: Reported on 10/28/2023), Disp: , Rfl:     Current Facility-Administered Medications:     0.9 % sodium chloride  infusion, , IntraVENous, Continuous, Rithik Odea, Glendia Dasie, PA-C  Prior to Admission medications   Medication Sig Start Date End Date Taking? Authorizing Provider   cetirizine (ZYRTEC) 10 MG tablet Take 1 tablet  by mouth daily as needed for Allergies or Rhinitis   Yes [provider]   zonisamide (ZONEGRAN) 100 MG capsule Take 1 capsule by mouth daily 02/10/23  Yes [provider]   JUNEL FE 1/20 1-20 MG-MCG per tablet Take 1 tablet by mouth daily 03/29/23  Yes [provider]   acetaZOLAMIDE (DIAMOX) 250 MG tablet Take 1 tablet by mouth 2 times daily 02/10/23  Yes [provider]   diazePAM (VALIUM) 5 MG tablet Take 1 tablet by mouth as needed for Anxiety (Ordered pre procedure medication LP).  Patient not taking: Reported on 10/28/2023 09/21/23   [provider]         VITAL SIGNS   Vitals:    10/28/23 0906   BP: 114/82   Pulse: 61   Resp: 16   Temp: 98.4 F (36.9 C)   SpO2: 97%       PHYSICAL:   General: Alert and oriented, no acute distress.  Cardiovascular: Regular rate.  Lungs: No increased work of breathing.  Neuro: Alert and oriented.     The patient is an appropriate candidate to undergo the planned procedure.     Glendia Dasie Neysa DEVONNA, MD  Electronically signed 10/28/2023 at 10:37 AM    "

## 2023-10-28 NOTE — Progress Notes (Signed)
 "Pre Procedure Patient Instructions     Procedure Location hospital:IR PATIENT ARRIVAL PAT INSTRUCTIONS: [facility]: [address], [place] in the [facility] [address] [place]. Bring your parking ticket with you inside to be validated. Take the covered pathway to the Admitting Entrance.  Follow the signs to the Public Elevators/Lobby. Take the lobby elevators to the 7th Floor. Enter the Procedural Waiting Room, directly in front of you and check in.  Procedure Date 10/28/2023  Arrival Time 830    Your doctor determines your scheduled start time.  Depending on the facility where your procedure is taking place and the scheduled start time, you may be required to arrive up to 2 hours prior.  This is to allow time for registration, your preop assessment, any day of procedure testing and to meet with your care teams members.  This also allows your procedure to be performed earlier should there be a cancellation that day.  We appreciate your patience as we work to provide you with excellent service.    You may be required to be monitored for an extended period post procedure.  Your care team will discuss with you the day of your procedure.    Medications: Follow the medication instructions below to prevent your procedure from being cancelled.    Medication to be taken the morning of surgery with a few sips of water only    You DO NOT need to hold Aspirin and NSAIDs prior to your Lumbar Puncture, Myelogram and or Intrathecal Injection.    If you are taking blood thinners and have not been told when to hold the dose, call the doctor who requested your procedure for instructions on when to stop.    If you are taking any diabetes and or weight loss medications (including injectables and shots) not discussed during the call with the PreAdmission Nurse, it is important to call [phone] to discuss important instructions.    On the day of your procedure, a nurse will review your medications and ask when  you last took each one.      Procedure Preparation    Diet Restrictions:IR DIET RESTRICTIONS: No food or drink including gum or mints-6 hours prior to procedure.  NOTHING AFTER: 430 AM      To cancel, reschedule or if you have specific questions about your scheduled procedure call IR Scheduling at [phone] Monday-Friday 8a-4p.    Skin Preparation:  Do not put on any deodorants, lotions, powders, or oils on the day of procedure.  Be sure to put on clean, comfortable loose fitting clothing.    Other Preparation:  Call your doctor right away if you have any fever, cold or flu symptoms      Day of Procedure Patient Instruction:  Do not smoke, vape, chew tobacco, drink alcohol or use recreational drugs on the day of your procedure  Remove all piercings and metal accessories  Do not wear artificial nails and only clear nail polish on natural nails. Nails must be trimmed to fingertip length to make sure the oxygen probe fits properly and to avoid injury  Do not wear artificial eye lashes because they can cause dry eyes, eye scratches, eye infections and allergic reactions  Do not use hair extensions with metal clips or hairstyles near the back of your neck, as it can make it difficult to safely manage your breathing during anesthesia  If your hair is tightly braided or styled in a complex way, it may need to be  undone for your safety  Do not wear contacts, tampons, make-up, lotions, creams, powders, fragrances or deodorant  Wear loose fit clothing  Do not bring valuables or money  Bring a copy of your Living Will and/or Medical Durable Power of Attorney if you have one  Bring a list of current medications including name and dosage  Bring a picture ID and insurance card               If you are going home the same day as your procedure, a support person should accompany you to the facility and must transport you home.  If you plan to take public transportation of any sort, your support person must accompany you home.   You should have someone to stay with you for 24 hours after your procedure with sedation of any kind.       Comments:     This information was reviewed with you during your Pre-Admission Testing interview and you verbalized understanding. If you have any additional questions please contact [phone].    To pre-register for your procedure OR for billing estimate questions please call [phone] Option 2 and then Option 3.    For financial questions AFTER your procedure is complete please contact [phone].    For MyChart Patient Portal help please call [phone].    For hotel accommodations please visit [email or web address] and select PATIENTS &VISITORS -> FOR VISITORS section and then TRAVEL INFORMATION -> PLACES TO STAY      "

## 2023-10-28 NOTE — Discharge Instructions (Signed)
"  Lumbar Puncture (LP) Discharge Instructions  Home care  Follow these tips when caring for yourself at home:  Once at home, rest for the remainder of the day.   It will take your body about 6 hours to replenish the small amount of spinal fluid that may have been removed today. You may develop a headache during this time. For relief, lie down and also drink plenty of fluids.  Resume your regular diet.  No driving for 24 hours.  Remove dressing in 24 hours, apply bandaid as needed.  May shower in 24 hours, no tub bath or swimming until site is well healed.  If any of your regular medicines were stopped for this procedure, start taking them again tomorrow.  You may use acetaminophen  or ibuprofen for pain relief, unless another medicine was prescribed. If you have chronic liver, kidney disease, or a history of stomach ulcers or GI bleeding, talk with your provider before taking these medications.   Follow-up care  Call your health care provider for a follow-up appointment   When to seek medical advice  Call your health care provider right away if any of these occur:  Head or neck pain that gets worse  You feel less alert or have difficulty waking up  Repeated vomiting  Swelling, drainage or redness at the puncture site  Fever above 100.66F (38C)    Please call the Radiology Department for any problems/questions related to the procedure.   [facility]: [phone]  [facility] [place]: [phone]  [facility]: [phone]  [facility]: [phone]    Call 911 for any Emergency.  -difficulty breathing  -chest pain  -loss of conciousness  Or any other emergency status  "

## 2023-10-29 LAB — CULTURE, CSF (WITH GRAM STAIN)
FINAL REPORT: NO GROWTH
Gram Stain Result: NONE SEEN
